# Patient Record
Sex: Female | Born: 1975 | Race: White | Hispanic: No | Smoking: Current every day smoker
Health system: Southern US, Community
[De-identification: ages and names within clinical notes are randomized; demographics above are authoritative.]

## PROBLEM LIST (undated history)

## (undated) ENCOUNTER — Emergency Department (HOSPITAL_COMMUNITY): Discharge status: Absent without leave | Payer: Medicare Other

## (undated) DIAGNOSIS — M1990 Unspecified osteoarthritis, unspecified site: Secondary | ICD-10-CM

## (undated) DIAGNOSIS — R569 Unspecified convulsions: Secondary | ICD-10-CM

## (undated) DIAGNOSIS — Z87898 Personal history of other specified conditions: Secondary | ICD-10-CM

## (undated) DIAGNOSIS — A419 Sepsis, unspecified organism: Secondary | ICD-10-CM

## (undated) DIAGNOSIS — K219 Gastro-esophageal reflux disease without esophagitis: Secondary | ICD-10-CM

## (undated) DIAGNOSIS — F419 Anxiety disorder, unspecified: Secondary | ICD-10-CM

## (undated) DIAGNOSIS — G709 Myoneural disorder, unspecified: Secondary | ICD-10-CM

## (undated) DIAGNOSIS — A4902 Methicillin resistant Staphylococcus aureus infection, unspecified site: Secondary | ICD-10-CM

## (undated) DIAGNOSIS — T7840XA Allergy, unspecified, initial encounter: Secondary | ICD-10-CM

## (undated) DIAGNOSIS — F502 Bulimia nervosa, unspecified: Secondary | ICD-10-CM

## (undated) DIAGNOSIS — F319 Bipolar disorder, unspecified: Secondary | ICD-10-CM

## (undated) DIAGNOSIS — E039 Hypothyroidism, unspecified: Secondary | ICD-10-CM

## (undated) DIAGNOSIS — F329 Major depressive disorder, single episode, unspecified: Secondary | ICD-10-CM

## (undated) DIAGNOSIS — S069XAA Unspecified intracranial injury with loss of consciousness status unknown, initial encounter: Secondary | ICD-10-CM

## (undated) DIAGNOSIS — R519 Headache, unspecified: Secondary | ICD-10-CM

## (undated) DIAGNOSIS — J449 Chronic obstructive pulmonary disease, unspecified: Secondary | ICD-10-CM

## (undated) DIAGNOSIS — T8859XA Other complications of anesthesia, initial encounter: Secondary | ICD-10-CM

## (undated) DIAGNOSIS — F32A Depression, unspecified: Secondary | ICD-10-CM

## (undated) DIAGNOSIS — K449 Diaphragmatic hernia without obstruction or gangrene: Secondary | ICD-10-CM

## (undated) DIAGNOSIS — D649 Anemia, unspecified: Secondary | ICD-10-CM

## (undated) DIAGNOSIS — F1910 Other psychoactive substance abuse, uncomplicated: Secondary | ICD-10-CM

## (undated) DIAGNOSIS — N200 Calculus of kidney: Secondary | ICD-10-CM

## (undated) DIAGNOSIS — R630 Anorexia: Secondary | ICD-10-CM

## (undated) DIAGNOSIS — G8929 Other chronic pain: Secondary | ICD-10-CM

## (undated) DIAGNOSIS — J45909 Unspecified asthma, uncomplicated: Secondary | ICD-10-CM

## (undated) DIAGNOSIS — J189 Pneumonia, unspecified organism: Secondary | ICD-10-CM

## (undated) DIAGNOSIS — N6009 Solitary cyst of unspecified breast: Secondary | ICD-10-CM

## (undated) HISTORY — DX: Sepsis, unspecified organism: A41.9

## (undated) HISTORY — PX: ABDOMINAL HYSTERECTOMY: SHX81

## (undated) HISTORY — DX: Anorexia: R63.0

## (undated) HISTORY — DX: Bulimia nervosa: F50.2

## (undated) HISTORY — DX: Bipolar disorder, unspecified: F31.9

## (undated) HISTORY — PX: COLONOSCOPY: SHX174

## (undated) HISTORY — PX: CARPAL TUNNEL RELEASE: SHX101

## (undated) HISTORY — DX: Unspecified osteoarthritis, unspecified site: M19.90

## (undated) HISTORY — DX: Unspecified asthma, uncomplicated: J45.909

## (undated) HISTORY — DX: Myoneural disorder, unspecified: G70.9

## (undated) HISTORY — DX: Gastro-esophageal reflux disease without esophagitis: K21.9

## (undated) HISTORY — DX: Other chronic pain: G89.29

## (undated) HISTORY — DX: Allergy, unspecified, initial encounter: T78.40XA

## (undated) HISTORY — DX: Unspecified convulsions: R56.9

## (undated) HISTORY — DX: Calculus of kidney: N20.0

## (undated) HISTORY — DX: Bulimia nervosa, unspecified: F50.20

## (undated) HISTORY — DX: Other psychoactive substance abuse, uncomplicated: F19.10

## (undated) HISTORY — PX: TYMPANOSTOMY TUBE PLACEMENT: SHX32

## (undated) HISTORY — DX: Pneumonia, unspecified organism: J18.9

## (undated) HISTORY — DX: Chronic obstructive pulmonary disease, unspecified: J44.9

## (undated) HISTORY — DX: Anemia, unspecified: D64.9

## (undated) HISTORY — DX: Anxiety disorder, unspecified: F41.9

## (undated) HISTORY — DX: Depression, unspecified: F32.A

## (undated) HISTORY — PX: OTHER SURGICAL HISTORY: SHX169

## (undated) HISTORY — DX: Solitary cyst of unspecified breast: N60.09

## (undated) HISTORY — DX: Personal history of other specified conditions: Z87.898

## (undated) HISTORY — DX: Hypothyroidism, unspecified: E03.9

## (undated) HISTORY — DX: Methicillin resistant Staphylococcus aureus infection, unspecified site: A49.02

## (undated) HISTORY — DX: Headache, unspecified: R51.9

## (undated) HISTORY — DX: Unspecified intracranial injury with loss of consciousness status unknown, initial encounter: S06.9XAA

## (undated) HISTORY — PX: UPPER GASTROINTESTINAL ENDOSCOPY: SHX188

## (undated) HISTORY — PX: KIDNEY STONE SURGERY: SHX686

## (undated) HISTORY — DX: Diaphragmatic hernia without obstruction or gangrene: K44.9

---
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
## 2019-07-07 ENCOUNTER — Ambulatory Visit (INDEPENDENT_AMBULATORY_CARE_PROVIDER_SITE_OTHER): Payer: Medicare Other | Admitting: Medical

## 2019-07-07 ENCOUNTER — Other Ambulatory Visit: Payer: Self-pay

## 2019-07-07 ENCOUNTER — Encounter: Payer: Self-pay | Admitting: Medical

## 2019-07-07 ENCOUNTER — Ambulatory Visit (HOSPITAL_BASED_OUTPATIENT_CLINIC_OR_DEPARTMENT_OTHER)
Admission: RE | Admit: 2019-07-07 | Discharge: 2019-07-07 | Disposition: A | Discharge status: Home / Self Care | Payer: Medicare Other | Source: Ambulatory Visit | Attending: Medical | Admitting: Medical

## 2019-07-07 VITALS — BP 100/65 | HR 88 | Temp 97.8°F | Ht 64.0 in | Wt 128.0 lb

## 2019-07-07 DIAGNOSIS — J301 Allergic rhinitis due to pollen: Secondary | ICD-10-CM

## 2019-07-07 DIAGNOSIS — M542 Cervicalgia: Secondary | ICD-10-CM | POA: Diagnosis present

## 2019-07-07 DIAGNOSIS — G40909 Epilepsy, unspecified, not intractable, without status epilepticus: Secondary | ICD-10-CM

## 2019-07-07 DIAGNOSIS — J45909 Unspecified asthma, uncomplicated: Secondary | ICD-10-CM

## 2019-07-07 DIAGNOSIS — K219 Gastro-esophageal reflux disease without esophagitis: Secondary | ICD-10-CM | POA: Diagnosis not present

## 2019-07-07 DIAGNOSIS — F419 Anxiety disorder, unspecified: Secondary | ICD-10-CM

## 2019-07-07 DIAGNOSIS — R300 Dysuria: Secondary | ICD-10-CM

## 2019-07-07 LAB — POCT URINALYSIS DIPSTICK (MANUAL)
Leukocytes, UA: NEGATIVE
Nitrite, UA: NEGATIVE
Poct Bilirubin: NEGATIVE
Poct Blood: NEGATIVE
Poct Glucose: NORMAL mg/dL
Poct Ketones: NEGATIVE
Poct Protein: NEGATIVE mg/dL
Poct Urobilinogen: NORMAL mg/dL
Spec Grav, UA: 1.02 (ref 1.010–1.025)
pH, UA: 6.5 (ref 5.0–8.0)

## 2019-07-07 IMAGING — DX DG CERVICAL SPINE COMPLETE 4+V
5 series · 5 of 5 positions shown · non-contrast
Comparison: None.

CLINICAL DATA: Chronic neck pain, no known injury.

EXAM:
CERVICAL SPINE - COMPLETE 4+ VIEW

[c-spine lat]
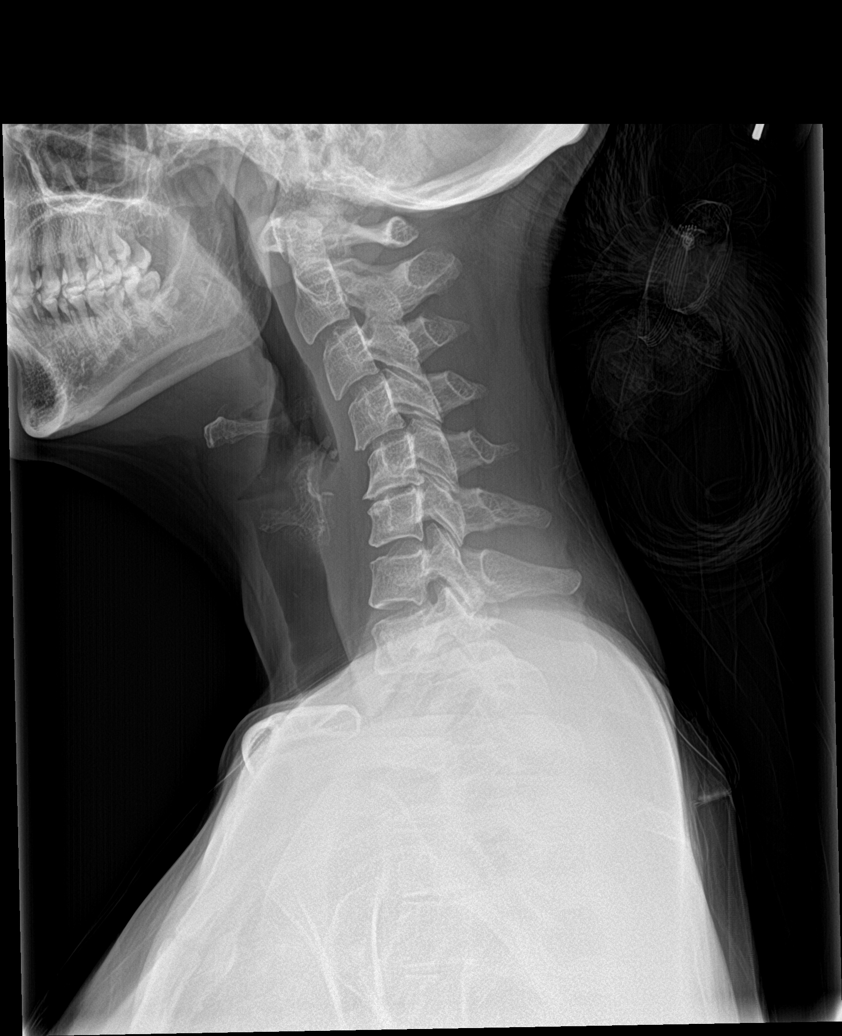

[c-spine obl (1 of 2)]
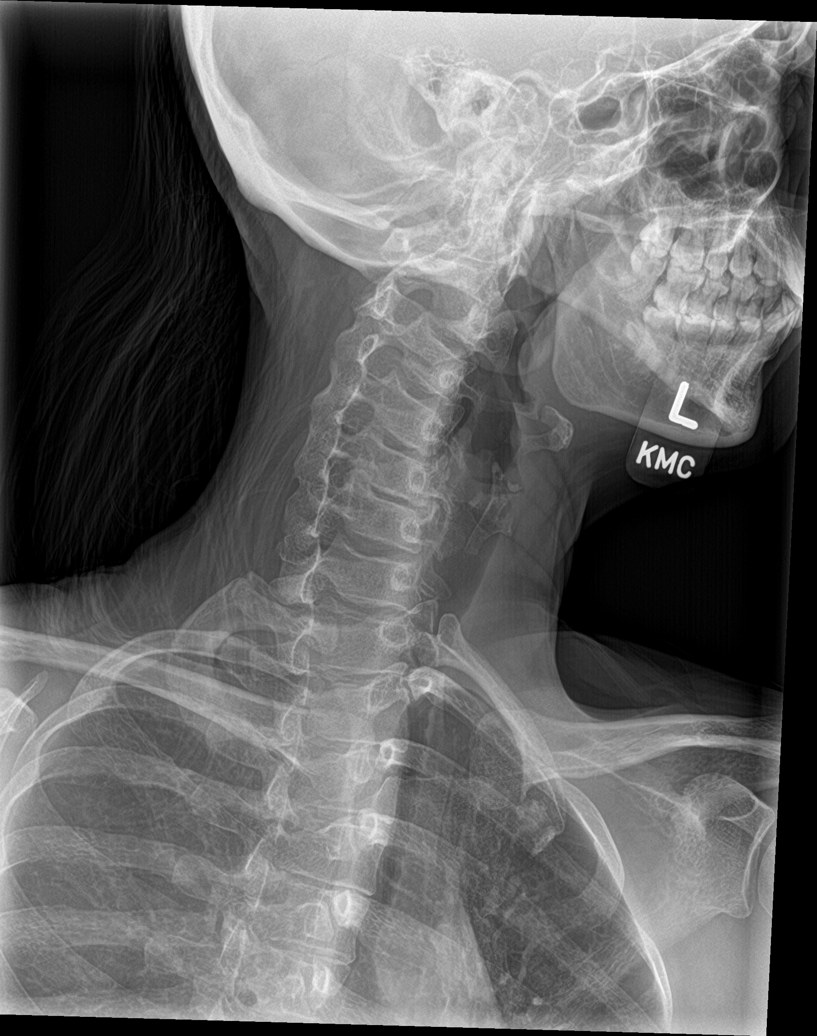

[c-spine obl (2 of 2)]
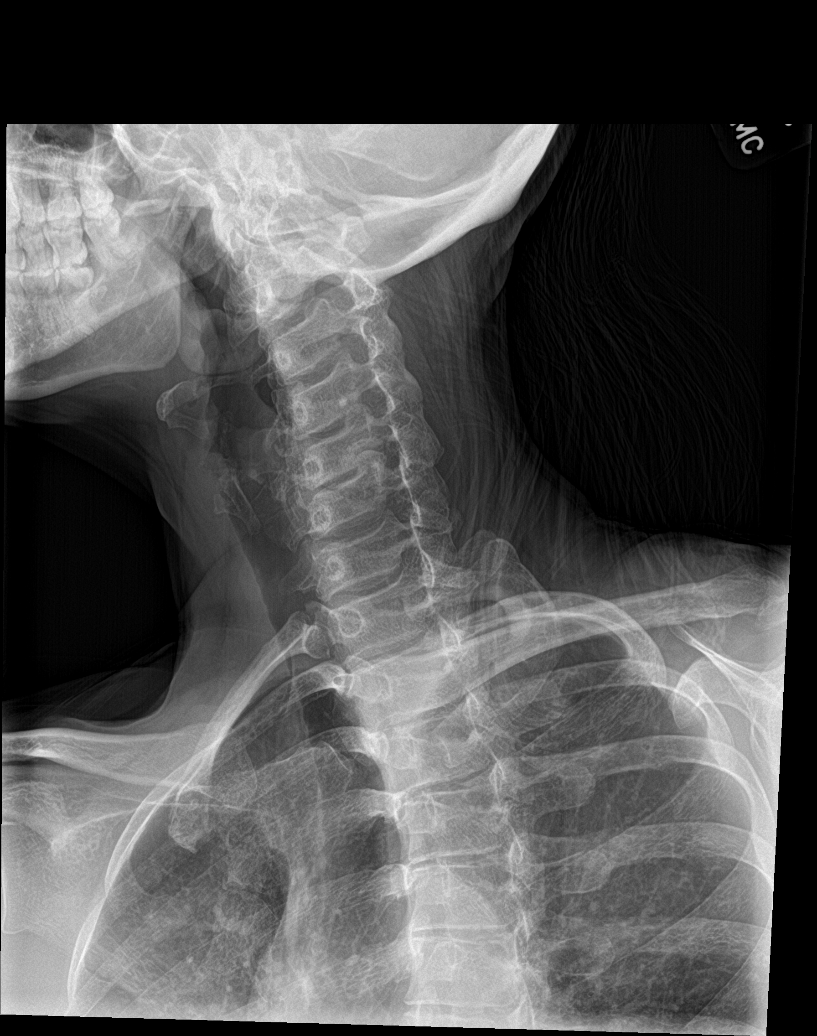

[c-spine ap]
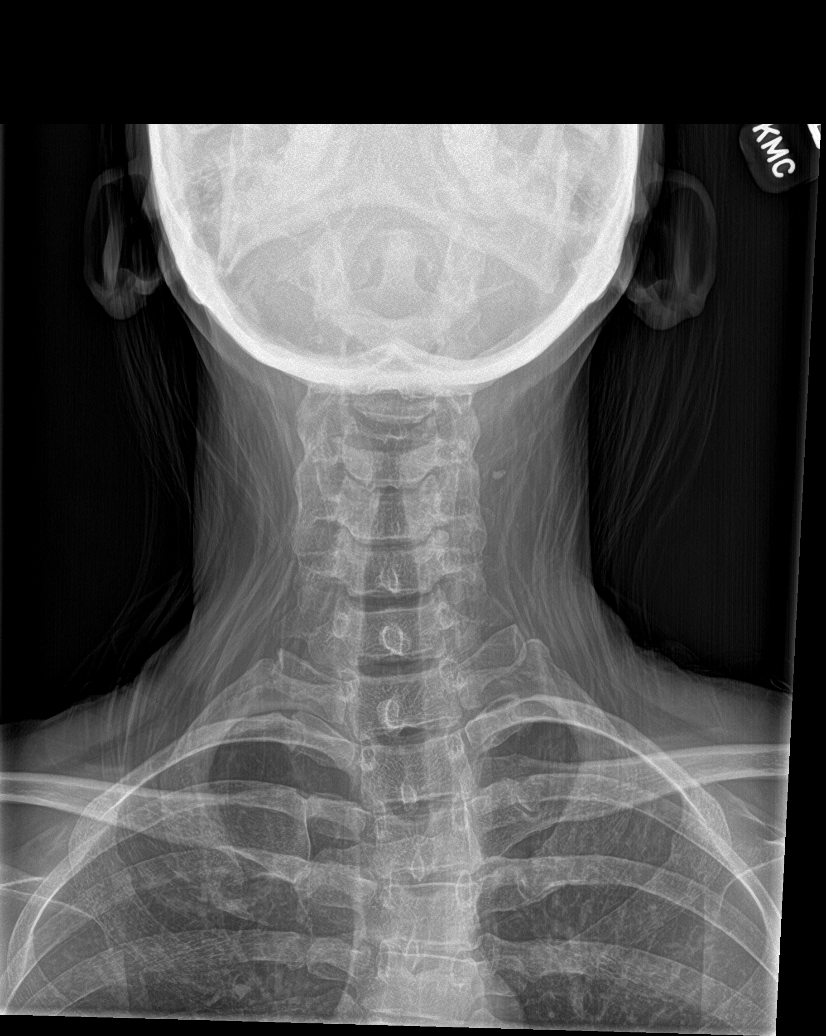

[c-spine open mouth]
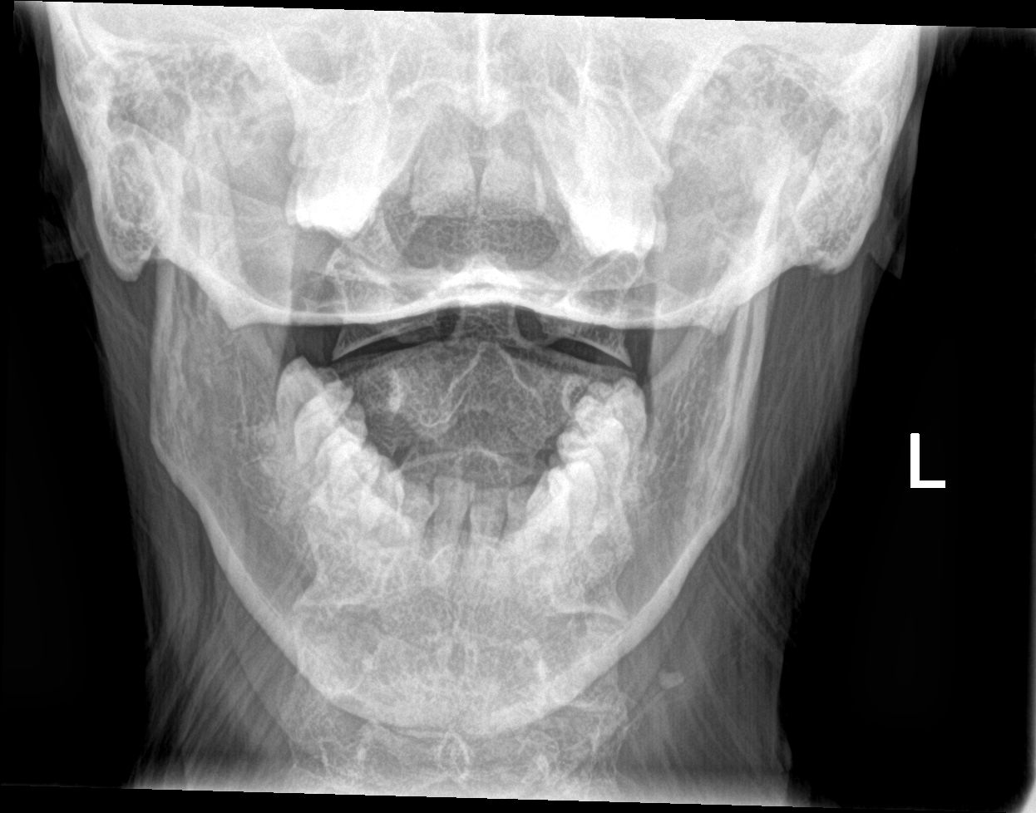

[5 of 5 positions shown; findings below may reference images not displayed]

FINDINGS: Loss of cervical lordosis. Degenerative disc disease at C4-5 and
C5-6 with disc space narrowing and spurring. No neural foraminal
narrowing or significant facet disease. No fracture or subluxation.
The prevertebral soft tissues are normal.
IMPRESSION: Mild degenerative disc and facet disease as above. Loss of cervical
lordosis. No acute bony abnormality.

## 2019-07-07 MED ORDER — LEVOCETIRIZINE DIHYDROCHLORIDE 5 MG PO TABS
5.0000 mg | ORAL_TABLET | Freq: Every evening | ORAL | 3 refills | Status: DC
Start: 2019-07-07 — End: 2019-12-01

## 2019-07-07 MED ORDER — METHOCARBAMOL 500 MG PO TABS: ORAL_TABLET | ORAL | 0 refills | Status: DC

## 2019-07-07 MED ORDER — OMEPRAZOLE 40 MG PO CPDR
40.0000 mg | DELAYED_RELEASE_CAPSULE | Freq: Every day | ORAL | 3 refills | Status: DC
Start: 2019-07-07 — End: 2019-09-04

## 2019-07-07 NOTE — Patient Instructions (Addendum)
For hx of seizures, continue keppra. I did place neurology referral. Please signs release of information form to get old records.  For allergies, will refill your flonase. Add xyzal to help with control.  For asthma well controlled continue albuterol as needed.  For gerd, continue omeprazole.  For anxiety continue with psychiatrist. He has you on high dose benzo. Keep seeing him as I can't write that high level.  For bipolar continue other meds rx'd by psychiatrist.  For dysuria last 2 days we did ua and will send out culture.  For neck pain will get cervical spine xray. Will rx robaxin to use at night.  Follow up in 2 weeks or as needed
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
## 2019-07-08 ENCOUNTER — Encounter: Payer: Self-pay | Admitting: Neurology

## 2019-07-08 LAB — URINE CULTURE
MICRO NUMBER:: 10431406
SPECIMEN QUALITY:: ADEQUATE

## 2019-07-10 ENCOUNTER — Telehealth: Payer: Self-pay

## 2019-07-10 NOTE — Telephone Encounter (Signed)
Labs reviewed.

## 2019-07-10 NOTE — Telephone Encounter (Signed)
Called pt and lvm to return call.  

## 2019-07-10 NOTE — Telephone Encounter (Signed)
Patient called in to see if PA Mackie Pai or the nurse can give her a call to discus her test results. Please call the patient at [phone]

## 2019-07-22 ENCOUNTER — Other Ambulatory Visit (HOSPITAL_COMMUNITY)
Admission: RE | Admit: 2019-07-22 | Discharge: 2019-07-22 | Disposition: A | Discharge status: Home / Self Care | Payer: Medicare Other | Source: Ambulatory Visit | Attending: Medical | Admitting: Medical

## 2019-07-22 ENCOUNTER — Other Ambulatory Visit: Payer: Self-pay

## 2019-07-22 ENCOUNTER — Ambulatory Visit (INDEPENDENT_AMBULATORY_CARE_PROVIDER_SITE_OTHER): Payer: Medicare Other | Admitting: Medical

## 2019-07-22 ENCOUNTER — Encounter: Payer: Self-pay | Admitting: Gastroenterology

## 2019-07-22 VITALS — BP 94/63 | HR 98 | Resp 18 | Ht 64.0 in | Wt 129.6 lb

## 2019-07-22 DIAGNOSIS — F419 Anxiety disorder, unspecified: Secondary | ICD-10-CM | POA: Diagnosis not present

## 2019-07-22 DIAGNOSIS — K219 Gastro-esophageal reflux disease without esophagitis: Secondary | ICD-10-CM

## 2019-07-22 DIAGNOSIS — Z113 Encounter for screening for infections with a predominantly sexual mode of transmission: Secondary | ICD-10-CM

## 2019-07-22 DIAGNOSIS — R300 Dysuria: Secondary | ICD-10-CM | POA: Diagnosis not present

## 2019-07-22 DIAGNOSIS — M542 Cervicalgia: Secondary | ICD-10-CM

## 2019-07-22 MED ORDER — CEPHALEXIN 500 MG PO CAPS
500.0000 mg | ORAL_CAPSULE | Freq: Two times a day (BID) | ORAL | 0 refills | Status: DC
Start: 2019-07-22 — End: 2019-09-03

## 2019-07-22 MED ORDER — FAMOTIDINE 20 MG PO TABS
20.0000 mg | ORAL_TABLET | Freq: Every day | ORAL | 3 refills | Status: DC
Start: 2019-07-22 — End: 2019-10-15

## 2019-07-22 MED ORDER — METHOCARBAMOL 500 MG PO TABS: ORAL_TABLET | ORAL | 0 refills | Status: DC

## 2019-07-22 NOTE — Patient Instructions (Addendum)
For gerd/reflux will continue omeprazole. Will add famotadine. Will go ahead and refer to GI.  Will get std screen. Urine ancillary and labs.  For dysuria and mixed flora culture will rx keflex.  For neck pain with bulging disc hx will refer to sport med. Refilled your robaxin.  Follow up one month or as needed
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
## 2019-07-23 ENCOUNTER — Telehealth: Payer: Self-pay | Admitting: Medical

## 2019-07-23 LAB — URINE CYTOLOGY ANCILLARY ONLY
Chlamydia: NEGATIVE
Comment: NEGATIVE
Comment: NEGATIVE
Comment: NORMAL
Neisseria Gonorrhea: NEGATIVE
Trichomonas: POSITIVE — AB

## 2019-07-23 LAB — HIV ANTIBODY (ROUTINE TESTING W REFLEX): HIV 1&2 Ab, 4th Generation: NONREACTIVE

## 2019-07-23 LAB — RPR: RPR Ser Ql: NONREACTIVE

## 2019-07-23 MED ORDER — METRONIDAZOLE 500 MG PO TABS: 500.0000 mg | ORAL_TABLET | Freq: Three times a day (TID) | ORAL | 0 refills | Status: DC

## 2019-07-23 NOTE — Telephone Encounter (Signed)
Flagyl rx sent to pt pharmacy.

## 2019-07-24 ENCOUNTER — Encounter: Payer: Self-pay | Admitting: Family Medicine

## 2019-07-24 ENCOUNTER — Other Ambulatory Visit: Payer: Self-pay

## 2019-07-24 ENCOUNTER — Ambulatory Visit (INDEPENDENT_AMBULATORY_CARE_PROVIDER_SITE_OTHER): Payer: Medicare Other | Admitting: Family Medicine

## 2019-07-24 VITALS — BP 103/67 | HR 80 | Ht 65.0 in | Wt 125.0 lb

## 2019-07-24 DIAGNOSIS — M7918 Myalgia, other site: Secondary | ICD-10-CM | POA: Diagnosis not present

## 2019-07-24 DIAGNOSIS — M5442 Lumbago with sciatica, left side: Secondary | ICD-10-CM | POA: Insufficient documentation

## 2019-07-24 DIAGNOSIS — M5440 Lumbago with sciatica, unspecified side: Secondary | ICD-10-CM | POA: Insufficient documentation

## 2019-07-24 DIAGNOSIS — M542 Cervicalgia: Secondary | ICD-10-CM | POA: Diagnosis not present

## 2019-07-24 DIAGNOSIS — M899 Disorder of bone, unspecified: Secondary | ICD-10-CM | POA: Insufficient documentation
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
## 2019-07-24 NOTE — Patient Instructions (Signed)
Nice to meet you  Please try heat  Please try a thera cane or lacrosse ball for massage  Please try to the exercises  Please take 2 Rayos as close to 10 pm until you run out.  Physical therapy will give you a call Please send me a message in MyChart with any questions or updates.  Please see me back in 4 weeks.   --Dr. Raeford Razor

## 2019-07-24 NOTE — Progress Notes (Signed)
Medication Samples have been provided to the patient.  Drug name: Rayos      Strength: 5mg         Qty: 2 Boxes  LOT: [id number]  Exp.Date: 01/2020  Dosing instructions: Take 2 tablets by mouth at bedtime.  The patient has been instructed regarding the correct time, dose, and frequency of taking this medication, including desired effects and most common side effects.   Sherrie George, [place] 11:23 AM 07/24/2019

## 2019-07-28 ENCOUNTER — Ambulatory Visit: Payer: Medicare Other | Attending: Family Medicine | Admitting: Physical Therapy

## 2019-07-28 ENCOUNTER — Encounter: Payer: Self-pay | Admitting: Physical Therapy

## 2019-07-28 ENCOUNTER — Other Ambulatory Visit: Payer: Self-pay | Admitting: Medical

## 2019-07-28 ENCOUNTER — Encounter: Payer: Self-pay | Admitting: Medical

## 2019-07-28 ENCOUNTER — Other Ambulatory Visit: Payer: Self-pay

## 2019-07-28 DIAGNOSIS — R29898 Other symptoms and signs involving the musculoskeletal system: Secondary | ICD-10-CM | POA: Insufficient documentation

## 2019-07-28 DIAGNOSIS — R29818 Other symptoms and signs involving the nervous system: Secondary | ICD-10-CM | POA: Diagnosis present

## 2019-07-28 DIAGNOSIS — M5442 Lumbago with sciatica, left side: Secondary | ICD-10-CM | POA: Insufficient documentation

## 2019-07-28 DIAGNOSIS — M6281 Muscle weakness (generalized): Secondary | ICD-10-CM | POA: Diagnosis present

## 2019-07-28 DIAGNOSIS — M5413 Radiculopathy, cervicothoracic region: Secondary | ICD-10-CM | POA: Insufficient documentation

## 2019-07-28 DIAGNOSIS — M5441 Lumbago with sciatica, right side: Secondary | ICD-10-CM | POA: Diagnosis present

## 2019-07-28 DIAGNOSIS — R262 Difficulty in walking, not elsewhere classified: Secondary | ICD-10-CM | POA: Diagnosis present

## 2019-07-28 DIAGNOSIS — G8929 Other chronic pain: Secondary | ICD-10-CM | POA: Insufficient documentation

## 2019-07-28 DIAGNOSIS — M542 Cervicalgia: Secondary | ICD-10-CM | POA: Insufficient documentation
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
## 2019-07-29 ENCOUNTER — Other Ambulatory Visit: Payer: Self-pay | Admitting: Medical

## 2019-07-30 ENCOUNTER — Ambulatory Visit: Payer: Medicare Other

## 2019-07-30 ENCOUNTER — Other Ambulatory Visit: Payer: Self-pay

## 2019-07-30 DIAGNOSIS — M5442 Lumbago with sciatica, left side: Secondary | ICD-10-CM

## 2019-07-30 DIAGNOSIS — G8929 Other chronic pain: Secondary | ICD-10-CM

## 2019-07-30 DIAGNOSIS — M542 Cervicalgia: Secondary | ICD-10-CM | POA: Diagnosis not present

## 2019-07-30 DIAGNOSIS — R29818 Other symptoms and signs involving the nervous system: Secondary | ICD-10-CM

## 2019-07-30 DIAGNOSIS — R262 Difficulty in walking, not elsewhere classified: Secondary | ICD-10-CM

## 2019-07-30 DIAGNOSIS — M6281 Muscle weakness (generalized): Secondary | ICD-10-CM

## 2019-07-30 DIAGNOSIS — R29898 Other symptoms and signs involving the musculoskeletal system: Secondary | ICD-10-CM

## 2019-07-30 DIAGNOSIS — M5413 Radiculopathy, cervicothoracic region: Secondary | ICD-10-CM

## 2019-07-30 NOTE — Patient Instructions (Addendum)
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
## 2019-08-05 ENCOUNTER — Ambulatory Visit: Payer: Medicare Other

## 2019-08-06 ENCOUNTER — Other Ambulatory Visit: Payer: Self-pay

## 2019-08-06 ENCOUNTER — Ambulatory Visit (INDEPENDENT_AMBULATORY_CARE_PROVIDER_SITE_OTHER): Payer: Medicare Other | Admitting: Medical

## 2019-08-06 ENCOUNTER — Ambulatory Visit: Payer: Medicare Other | Attending: Family Medicine

## 2019-08-06 ENCOUNTER — Other Ambulatory Visit (HOSPITAL_COMMUNITY)
Admission: RE | Admit: 2019-08-06 | Discharge: 2019-08-06 | Disposition: A | Discharge status: Home / Self Care | Payer: Medicare Other | Source: Ambulatory Visit | Attending: Medical | Admitting: Medical

## 2019-08-06 VITALS — BP 93/70 | HR 75 | Resp 18 | Ht 65.0 in | Wt 133.0 lb

## 2019-08-06 DIAGNOSIS — M5413 Radiculopathy, cervicothoracic region: Secondary | ICD-10-CM | POA: Insufficient documentation

## 2019-08-06 DIAGNOSIS — M79641 Pain in right hand: Secondary | ICD-10-CM | POA: Diagnosis not present

## 2019-08-06 DIAGNOSIS — Z113 Encounter for screening for infections with a predominantly sexual mode of transmission: Secondary | ICD-10-CM | POA: Diagnosis not present

## 2019-08-06 DIAGNOSIS — M6281 Muscle weakness (generalized): Secondary | ICD-10-CM | POA: Diagnosis present

## 2019-08-06 DIAGNOSIS — M542 Cervicalgia: Secondary | ICD-10-CM

## 2019-08-06 DIAGNOSIS — M5442 Lumbago with sciatica, left side: Secondary | ICD-10-CM | POA: Insufficient documentation

## 2019-08-06 DIAGNOSIS — R29898 Other symptoms and signs involving the musculoskeletal system: Secondary | ICD-10-CM | POA: Diagnosis present

## 2019-08-06 DIAGNOSIS — R262 Difficulty in walking, not elsewhere classified: Secondary | ICD-10-CM | POA: Insufficient documentation

## 2019-08-06 DIAGNOSIS — M5441 Lumbago with sciatica, right side: Secondary | ICD-10-CM | POA: Diagnosis present

## 2019-08-06 DIAGNOSIS — J301 Allergic rhinitis due to pollen: Secondary | ICD-10-CM

## 2019-08-06 DIAGNOSIS — R29818 Other symptoms and signs involving the nervous system: Secondary | ICD-10-CM

## 2019-08-06 DIAGNOSIS — G8929 Other chronic pain: Secondary | ICD-10-CM
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
## 2019-08-06 NOTE — Progress Notes (Signed)
tri

## 2019-08-06 NOTE — Patient Instructions (Signed)
For recent history of trichomonas will get repeat urine ancillary test post treatment at your request.  For rt hand and wrist pain use wrist cock up splint daily. Hx of injury to that ext/desribe nerve injury. Also some possible carpel tunnel syndrome possible. Will see what sport medicine and neurologist.  Allergies controlled with xyzal and flonase.  Neck pain improved with muscle relaxant and night. Seeing PT as well.  Follow up 6 weeks.
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
## 2019-08-07 LAB — URINE CYTOLOGY ANCILLARY ONLY
Comment: NEGATIVE
Trichomonas: POSITIVE — AB

## 2019-08-08 ENCOUNTER — Other Ambulatory Visit: Payer: Self-pay | Admitting: Medical

## 2019-08-09 ENCOUNTER — Telehealth: Payer: Self-pay | Admitting: Medical

## 2019-08-09 MED ORDER — TINIDAZOLE 500 MG PO TABS: ORAL_TABLET | ORAL | 0 refills | Status: DC

## 2019-08-09 NOTE — Telephone Encounter (Signed)
tinazadole rx sent to pt pharmacy.

## 2019-08-11 ENCOUNTER — Telehealth: Payer: Self-pay | Admitting: Medical

## 2019-08-11 ENCOUNTER — Ambulatory Visit: Payer: Medicare Other | Admitting: Physical Therapy

## 2019-08-11 NOTE — Telephone Encounter (Signed)
Caller : Sharon Bolton  Call Back # [phone]   Patient calling in regards to lab results, patient is requesting a call back .

## 2019-08-13 ENCOUNTER — Other Ambulatory Visit: Payer: Self-pay

## 2019-08-13 ENCOUNTER — Encounter: Payer: Self-pay | Admitting: Physical Therapy

## 2019-08-13 ENCOUNTER — Ambulatory Visit: Payer: Medicare Other | Admitting: Physical Therapy

## 2019-08-13 DIAGNOSIS — M6281 Muscle weakness (generalized): Secondary | ICD-10-CM

## 2019-08-13 DIAGNOSIS — G8929 Other chronic pain: Secondary | ICD-10-CM

## 2019-08-13 DIAGNOSIS — R29818 Other symptoms and signs involving the nervous system: Secondary | ICD-10-CM

## 2019-08-13 DIAGNOSIS — R29898 Other symptoms and signs involving the musculoskeletal system: Secondary | ICD-10-CM

## 2019-08-13 DIAGNOSIS — M542 Cervicalgia: Secondary | ICD-10-CM

## 2019-08-13 DIAGNOSIS — R262 Difficulty in walking, not elsewhere classified: Secondary | ICD-10-CM

## 2019-08-13 DIAGNOSIS — M5413 Radiculopathy, cervicothoracic region: Secondary | ICD-10-CM

## 2019-08-13 MED ORDER — FLUCONAZOLE 150 MG PO TABS
150.0000 mg | ORAL_TABLET | Freq: Once | ORAL | 0 refills | Status: AC
Start: 2019-08-13 — End: 2019-08-13
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
## 2019-08-15 ENCOUNTER — Ambulatory Visit: Payer: Medicare Other | Admitting: Nurse Practitioner

## 2019-08-18 ENCOUNTER — Ambulatory Visit (INDEPENDENT_AMBULATORY_CARE_PROVIDER_SITE_OTHER): Payer: Medicare Other | Admitting: Family Medicine

## 2019-08-18 ENCOUNTER — Ambulatory Visit: Payer: Medicare Other

## 2019-08-18 ENCOUNTER — Other Ambulatory Visit: Payer: Self-pay

## 2019-08-18 ENCOUNTER — Ambulatory Visit (HOSPITAL_BASED_OUTPATIENT_CLINIC_OR_DEPARTMENT_OTHER)
Admission: RE | Admit: 2019-08-18 | Discharge: 2019-08-18 | Disposition: A | Discharge status: Home / Self Care | Payer: Medicare Other | Source: Ambulatory Visit | Attending: Family Medicine | Admitting: Family Medicine

## 2019-08-18 ENCOUNTER — Other Ambulatory Visit: Payer: Self-pay | Admitting: Medical

## 2019-08-18 ENCOUNTER — Encounter: Payer: Self-pay | Admitting: Family Medicine

## 2019-08-18 VITALS — BP 104/69 | HR 71 | Ht 64.0 in | Wt 125.0 lb

## 2019-08-18 DIAGNOSIS — M542 Cervicalgia: Secondary | ICD-10-CM | POA: Diagnosis not present

## 2019-08-18 DIAGNOSIS — M79671 Pain in right foot: Secondary | ICD-10-CM | POA: Insufficient documentation

## 2019-08-18 DIAGNOSIS — R519 Headache, unspecified: Secondary | ICD-10-CM

## 2019-08-18 DIAGNOSIS — R29818 Other symptoms and signs involving the nervous system: Secondary | ICD-10-CM

## 2019-08-18 DIAGNOSIS — R296 Repeated falls: Secondary | ICD-10-CM | POA: Insufficient documentation

## 2019-08-18 DIAGNOSIS — M5441 Lumbago with sciatica, right side: Secondary | ICD-10-CM

## 2019-08-18 DIAGNOSIS — G8929 Other chronic pain: Secondary | ICD-10-CM

## 2019-08-18 DIAGNOSIS — M5413 Radiculopathy, cervicothoracic region: Secondary | ICD-10-CM

## 2019-08-18 DIAGNOSIS — R262 Difficulty in walking, not elsewhere classified: Secondary | ICD-10-CM

## 2019-08-18 DIAGNOSIS — M6281 Muscle weakness (generalized): Secondary | ICD-10-CM

## 2019-08-18 DIAGNOSIS — R29898 Other symptoms and signs involving the musculoskeletal system: Secondary | ICD-10-CM

## 2019-08-18 DIAGNOSIS — M7918 Myalgia, other site: Secondary | ICD-10-CM

## 2019-08-18 IMAGING — DX DG ANKLE COMPLETE 3+V*R*
3 series · 3 of 3 positions shown · non-contrast
Comparison: None.

CLINICAL DATA: Chronic right foot and ankle pain

EXAM:
RIGHT ANKLE - COMPLETE 3+ VIEW; RIGHT FOOT COMPLETE - 3+ VIEW

[ankle ap]
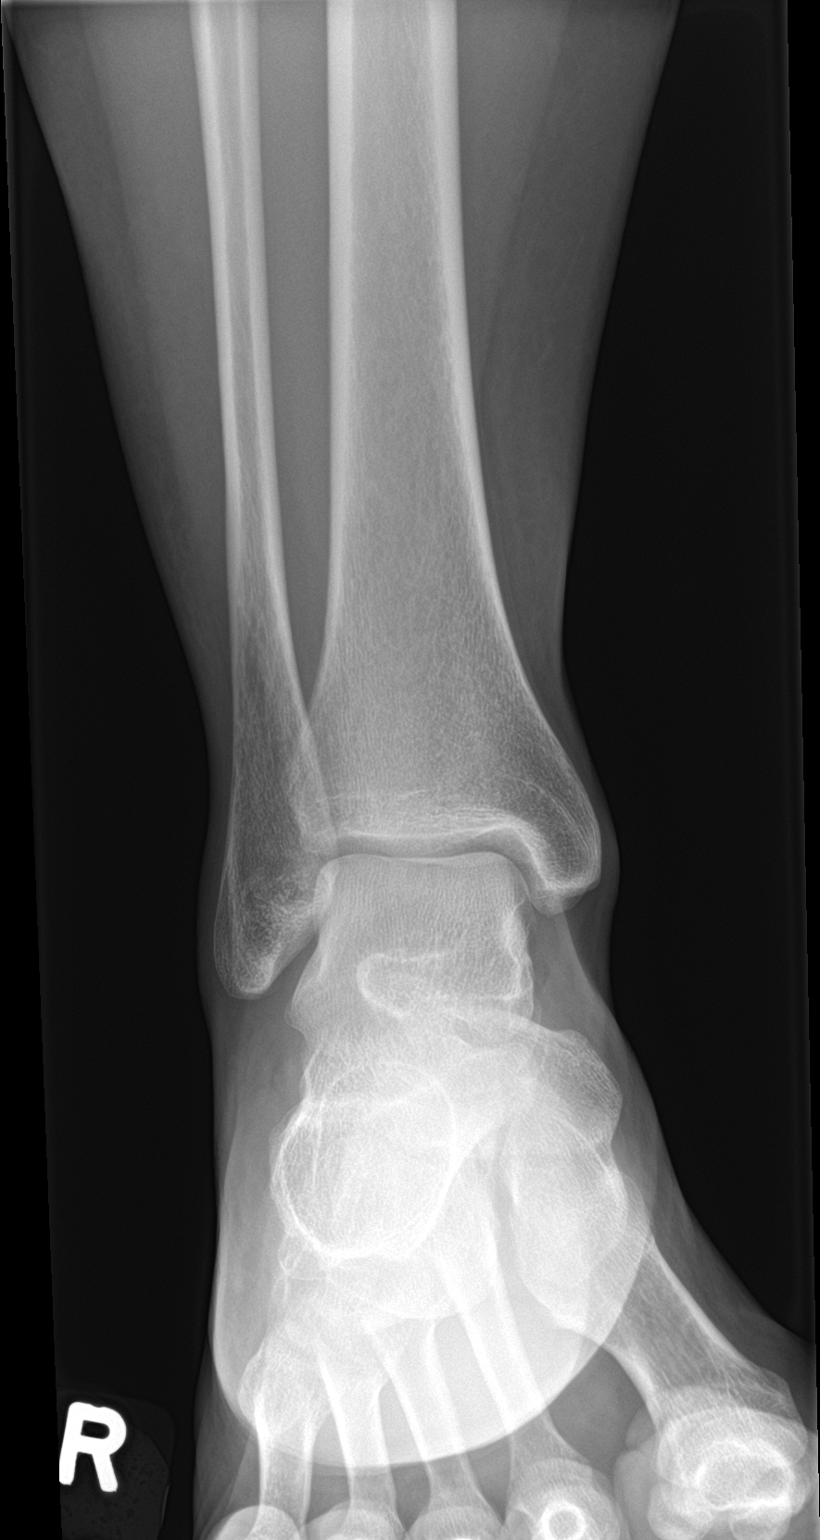

[ankle obl]
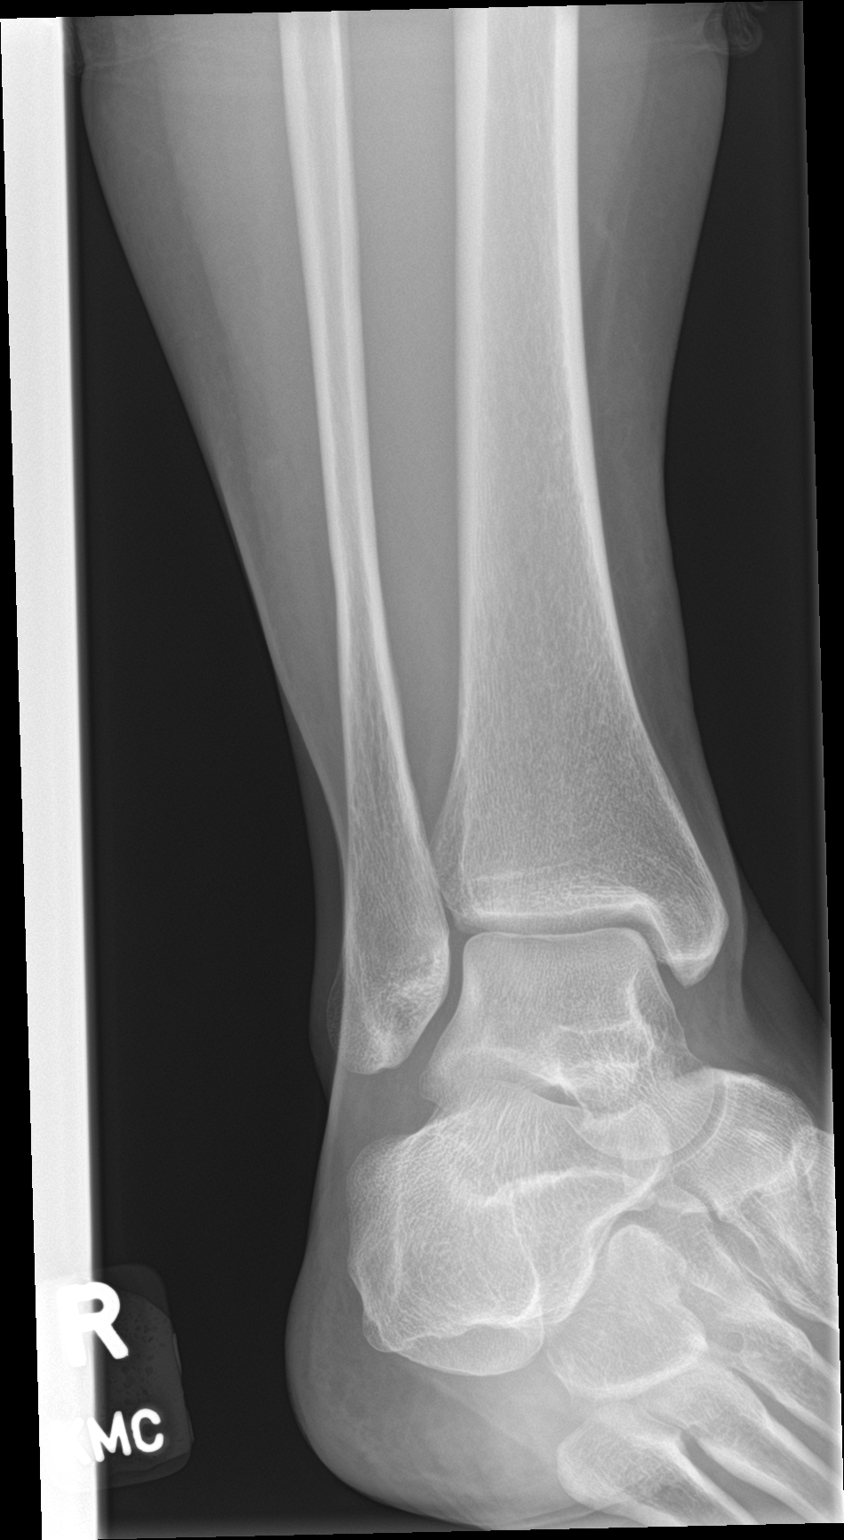

[ankle lat]
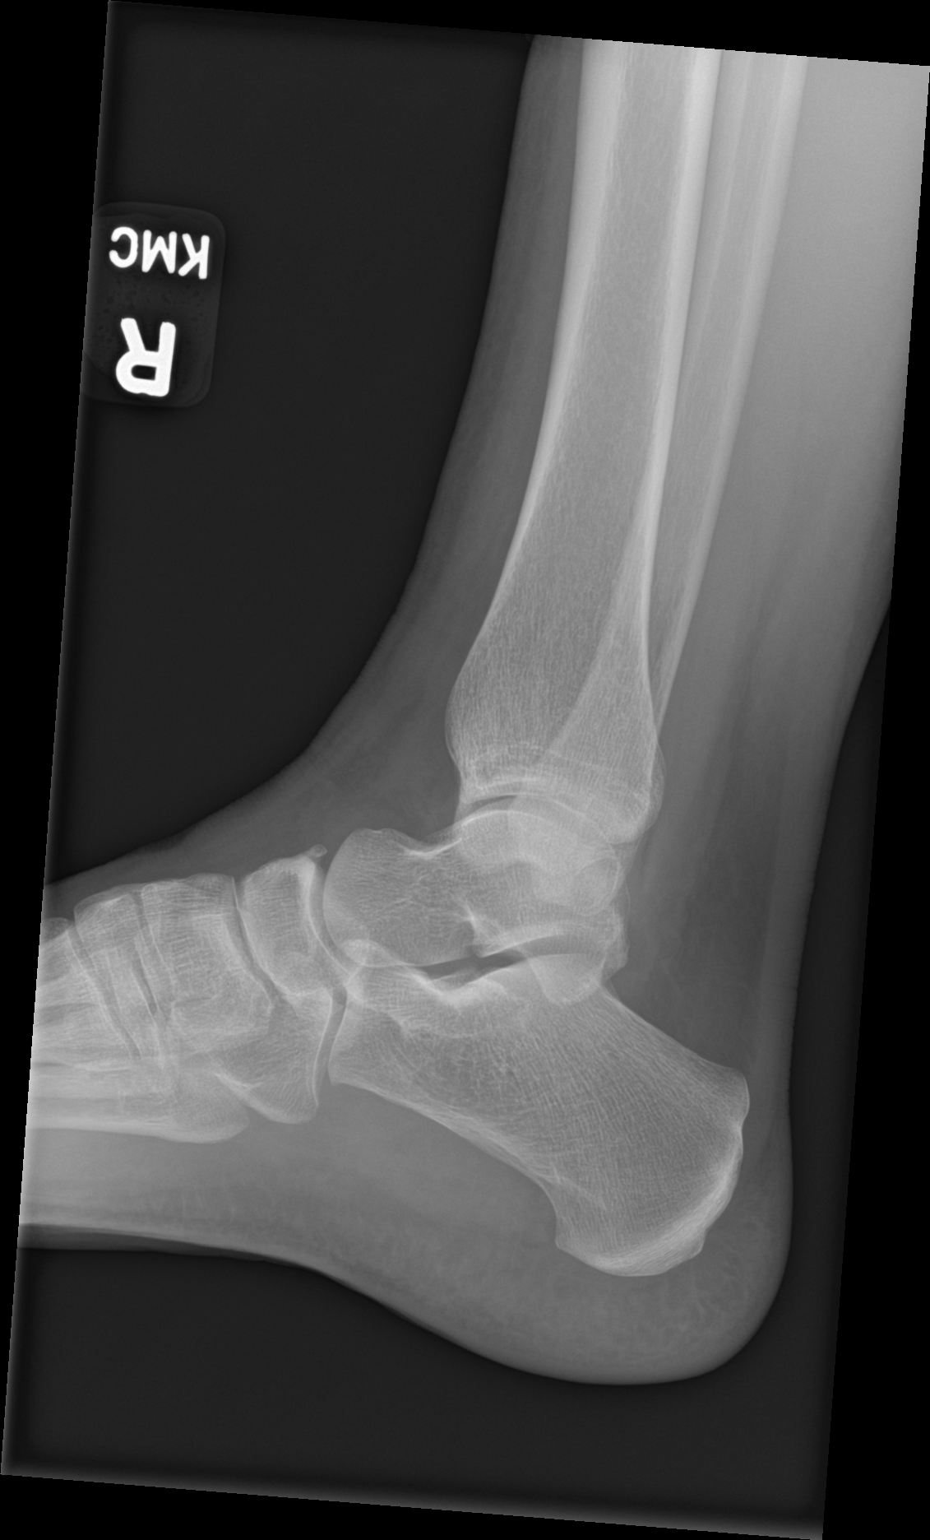

[3 of 3 positions shown; findings below may reference images not displayed]

FINDINGS: There is no evidence of fracture, dislocation, or joint effusion.
Incidental note of a small area of sclerosis within the great toe
distal phalanx, likely a bone island. There is no evidence of
arthropathy or other focal bone abnormality. Soft tissues are
unremarkable.
IMPRESSION: Essentially unremarkable radiographs of the right foot and ankle. No
acute findings or significant arthropathy.

## 2019-08-18 IMAGING — DX DG FOOT COMPLETE 3+V*R*
3 series · 3 of 3 positions shown · non-contrast
Comparison: None.

CLINICAL DATA: Chronic right foot and ankle pain

EXAM:
RIGHT ANKLE - COMPLETE 3+ VIEW; RIGHT FOOT COMPLETE - 3+ VIEW

[foot ap]
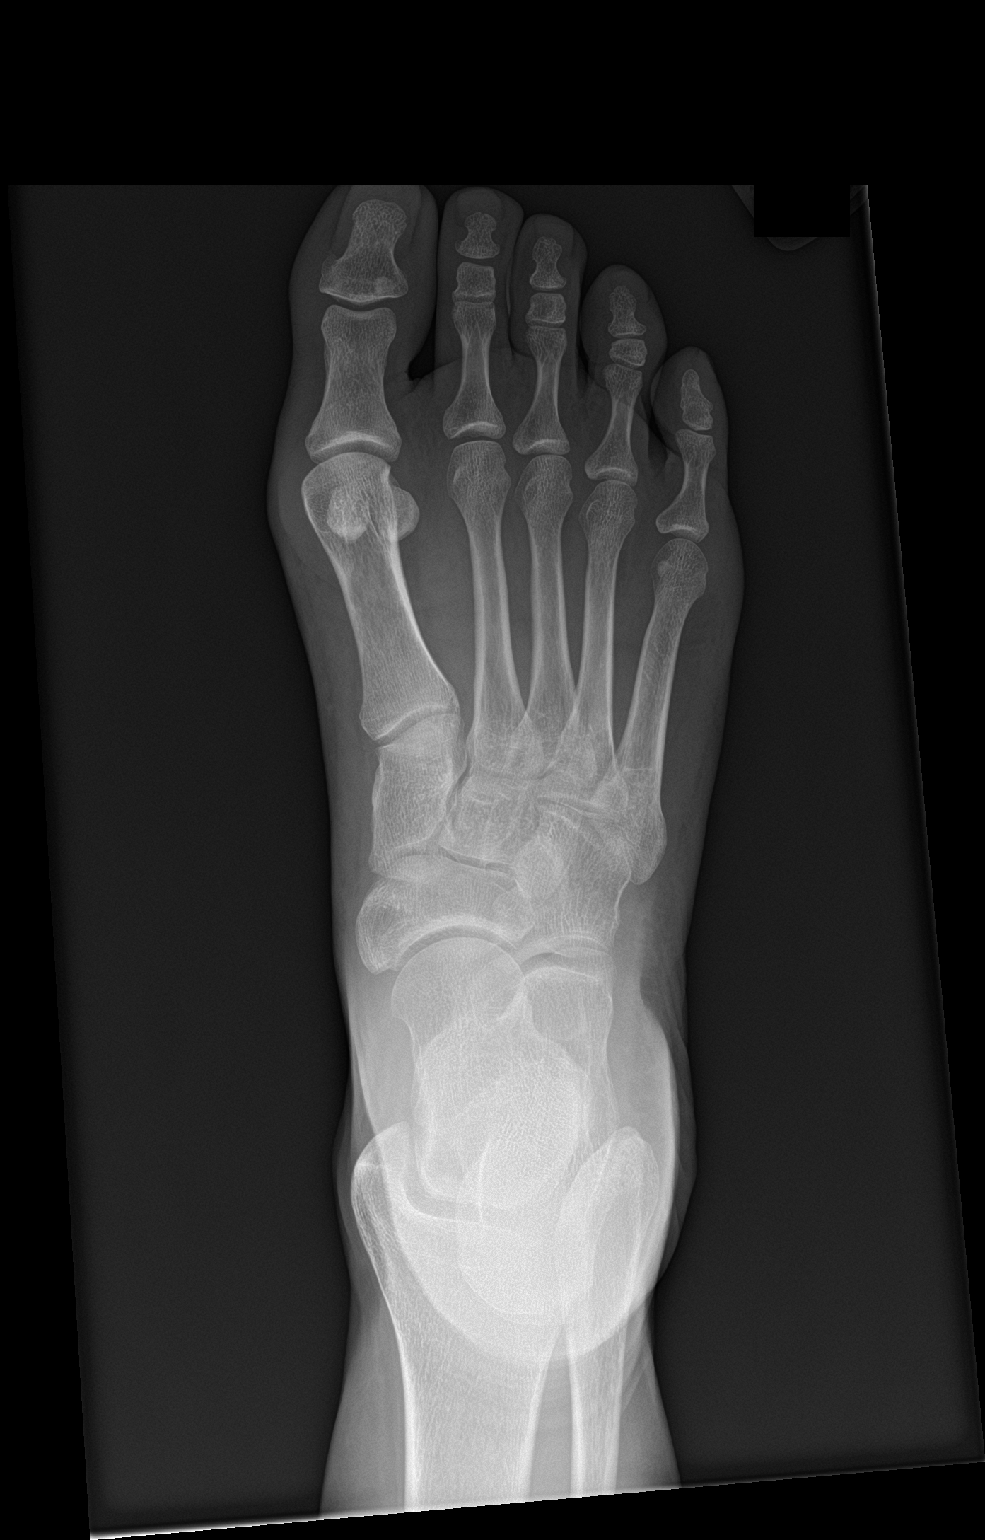

[foot obl]
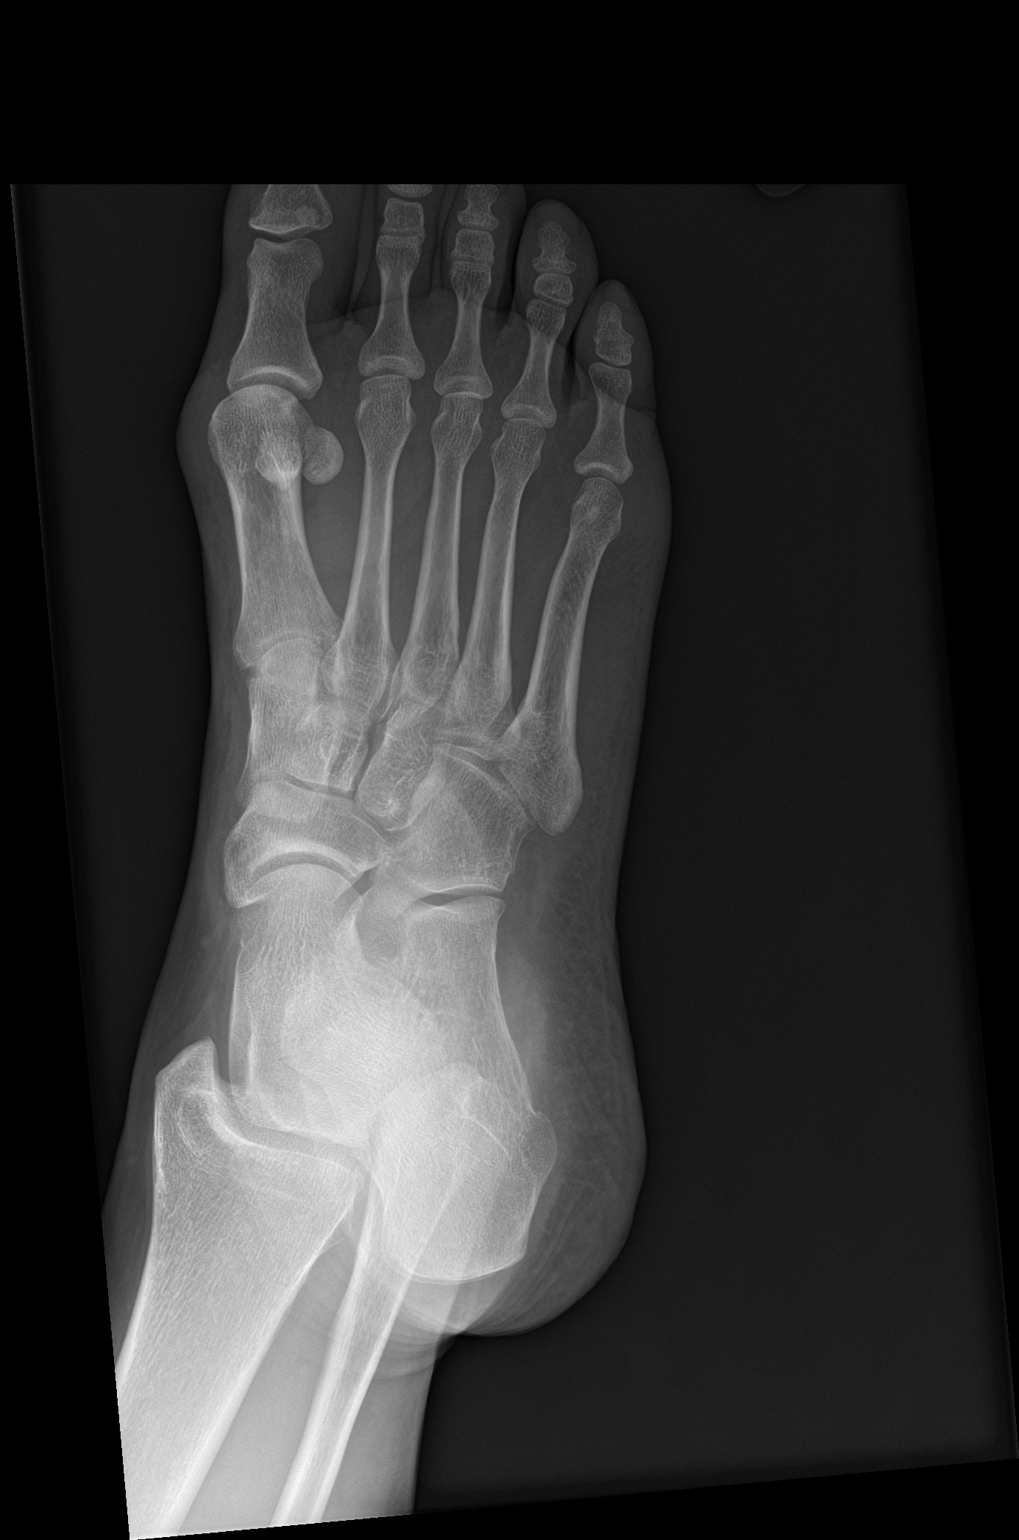

[foot lat]
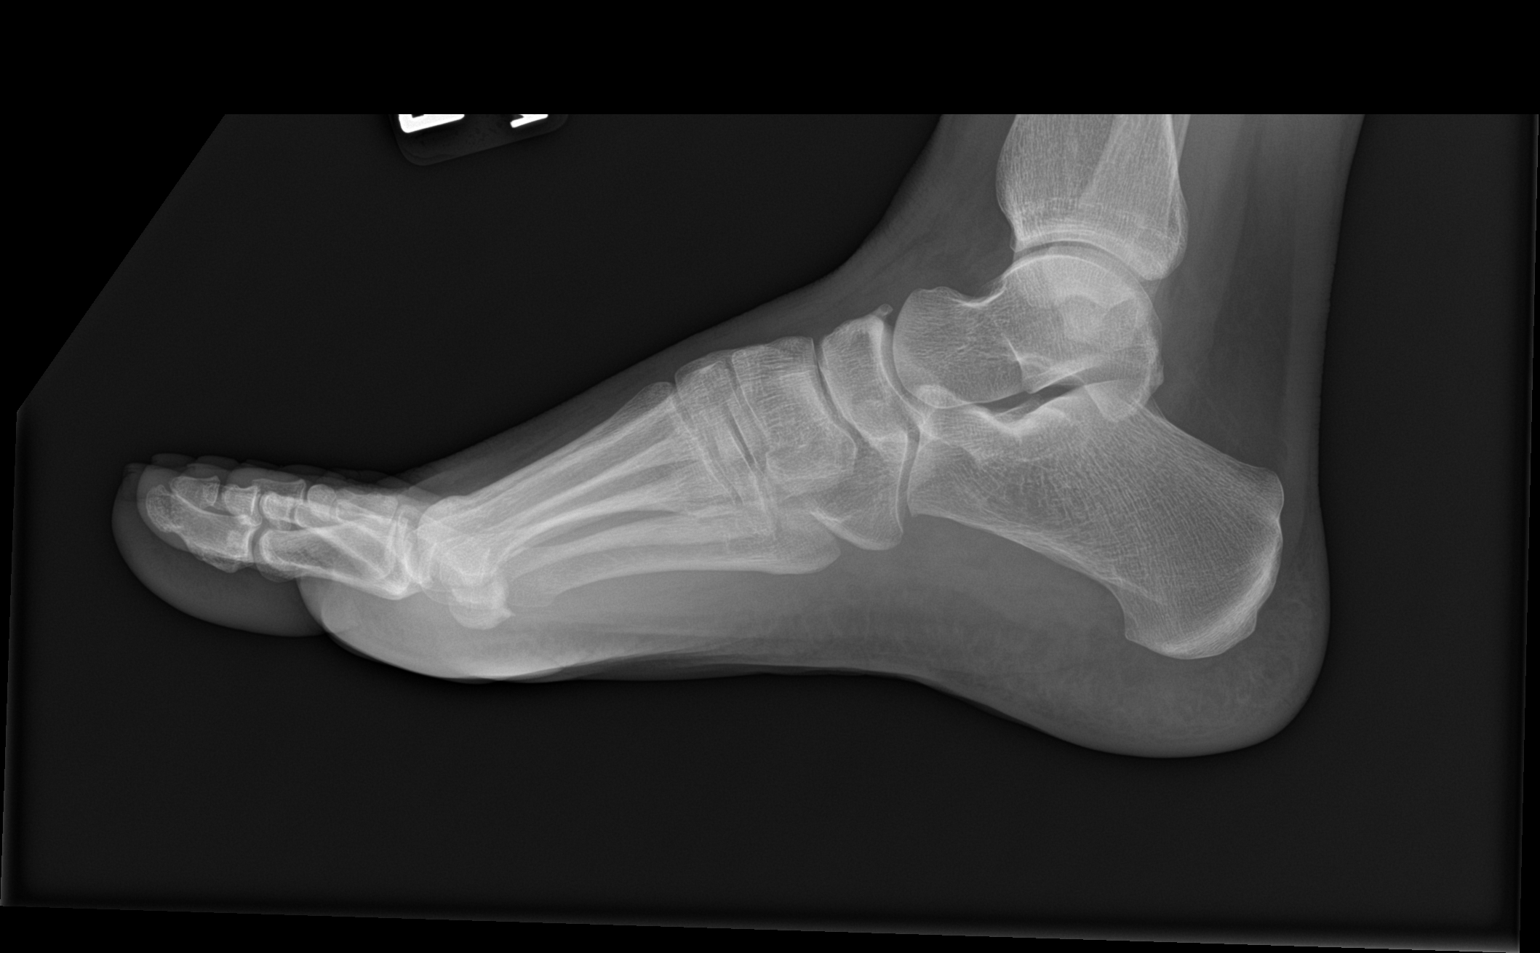

[3 of 3 positions shown; findings below may reference images not displayed]

FINDINGS: There is no evidence of fracture, dislocation, or joint effusion.
Incidental note of a small area of sclerosis within the great toe
distal phalanx, likely a bone island. There is no evidence of
arthropathy or other focal bone abnormality. Soft tissues are
unremarkable.
IMPRESSION: Essentially unremarkable radiographs of the right foot and ankle. No
acute findings or significant arthropathy.
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
## 2019-08-18 NOTE — Assessment & Plan Note (Addendum)
Had a fall recently.  Having pain over the dorsum of the foot.  Nonspecific pain. -Counseled on home exercise therapy and supportive care. -X-ray. -Placed a lateral heel wedge as she feels like she is going to supinate - pennsaid samples

## 2019-08-18 NOTE — Progress Notes (Signed)
Medication Samples have been provided to the patient.  Drug name: Pennsaid       Strength: 2%        Qty: 2 Boxes  LOT: [id number]  Exp.Date: 03/2020  Dosing instructions: Use a pea size amount and rub gently.  The patient has been instructed regarding the correct time, dose, and frequency of taking this medication, including desired effects and most common side effects.   Sherrie George, [place] 11:44 AM 08/18/2019
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
## 2019-08-18 NOTE — Patient Instructions (Signed)
Good to see you Please try the walker  Please try the rub on medicine  I will call with the results from today   Please send me a message in MyChart with any questions or updates.  Please see me back in 6-8 weeks.   --Dr. Raeford Razor

## 2019-08-18 NOTE — Progress Notes (Signed)
Sharon Bolton - 44 y.o. female MRN [id number]  Date of birth: 03-Aug-1975  SUBJECTIVE:  Including CC & ROS.  Chief Complaint  Patient presents with  . Follow-up    neck / back    Sharon Bolton is a 44 y.o. female that is presenting with headaches, multiple areas of pain and altered sensation and weakness as well as right foot and ankle pain.  She has been going to physical therapy.  She reports different sensations that she has been experiencing.  She does like physical therapy.  She seems to be falling more than usual.  She fell and now has right ankle pain over the anterior lateral aspect of the ankle.  Does not always tripped over something but feels weak and like her feet will give way.  She feels like certain body parts are disconnected from herself.   Review of Systems See HPI   HISTORY: Past Medical, Surgical, Social, and Family History Reviewed & Updated per EMR.   Pertinent Historical Findings include:  Past Medical History:  Diagnosis Date  . Allergy   . Anxiety   . Asthma   . Depression   . GERD (gastroesophageal reflux disease)   . Seizures ([place])     Past Surgical History:  Procedure Laterality Date  . ABDOMINAL HYSTERECTOMY      No family history on file.  Social History   Socioeconomic History  . Marital status: Single    Spouse name: Not on file  . Number of children: Not on file  . Years of education: Not on file  . Highest education level: Not on file  Occupational History  . Not on file  Tobacco Use  . Smoking status: Current Every Day Smoker    Packs/day: 1.00    Years: 25.00    Pack years: 25.00    Types: Cigarettes  . Smokeless tobacco: Never Used  Substance and Sexual Activity  . Alcohol use: Not Currently  . Drug use: Not Currently  . Sexual activity: Not Currently  Other Topics Concern  . Not on file  Social History Narrative  . Not on file   Social Determinants of Health   Financial Resource Strain:   . Difficulty of Paying  Living Expenses:   Food Insecurity:   . Worried About Charity fundraiser in the Last Year:   . Arboriculturist in the Last Year:   Transportation Needs:   . Film/video editor (Medical):   Marland Kitchen Lack of Transportation (Non-Medical):   Physical Activity:   . Days of Exercise per Week:   . Minutes of Exercise per Session:   Stress:   . Feeling of Stress :   Social Connections:   . Frequency of Communication with Friends and Family:   . Frequency of Social Gatherings with Friends and Family:   . Attends Religious Services:   . Active Member of Clubs or Organizations:   . Attends Archivist Meetings:   Marland Kitchen Marital Status:   Intimate Partner Violence:   . Fear of Current or Ex-Partner:   . Emotionally Abused:   Marland Kitchen Physically Abused:   . Sexually Abused:      PHYSICAL EXAM:  VS: BP 104/69   Pulse 71   Ht 5\' 4"  (1.626 m)   Wt 125 lb (56.7 kg)   BMI 21.46 kg/m  Physical Exam Gen: NAD, alert, cooperative with exam, well-appearing MSK:  Deep tendon reflexes at the patella are symmetric. +2 deep tendon  reflexes at the Achilles. No signs of atrophy. Right ankle: Abrasion over the anterior lateral ankle joint. Negative anterior drawer. Normal strength resistance. No ecchymosis or swelling. Neurovascularly intact     ASSESSMENT & PLAN:   Nonintractable headache Has had multiple traumas.  Possible to be postconcussive versus polypharmacy versus tension. -Counseled on supportive care. -Could consider adding nortriptyline. -Has physical therapy but addressing other issues currently. -Could get updated imaging of brain -Could consider EMDR with psychology as she may have some post traumatic issues.  Right foot pain Had a fall recently.  Having pain over the dorsum of the foot.  Nonspecific pain. -Counseled on home exercise therapy and supportive care. -X-ray. -Placed a lateral heel wedge as she feels like she is going to supinate - pennsaid samples    Falls  frequently Reports frequent falls with no specific tripping.  Has dissociation with her extremities.  Possible for multiple injuries that she has sustained as a source.  Seems less likely for neuropathy.  Has seen neurologist in [place] prior brain placed here.  HIV and RPR were negative. -Counseled on supportive care. -Rolling walker. -CMP. -Could consider CBC, B12 and folate.  Myofascial pain Symptoms seem symptomatic but has had significant injuries in the past. -Could consider the addition of Cymbalta or nortriptyline. -Counseled on home exercise therapy and supportive care. -Could place a hold on physical therapy

## 2019-08-19 ENCOUNTER — Telehealth: Payer: Self-pay | Admitting: Family Medicine

## 2019-08-19 LAB — COMPREHENSIVE METABOLIC PANEL
ALT: 14 IU/L (ref 0–32)
AST: 16 IU/L (ref 0–40)
Albumin/Globulin Ratio: 1.9 (ref 1.2–2.2)
Albumin: 4.4 g/dL (ref 3.8–4.8)
Alkaline Phosphatase: 55 IU/L (ref 48–121)
BUN/Creatinine Ratio: 14 (ref 9–23)
BUN: 10 mg/dL (ref 6–24)
Bilirubin Total: 0.2 mg/dL (ref 0.0–1.2)
CO2: 25 mmol/L (ref 20–29)
Calcium: 9.4 mg/dL (ref 8.7–10.2)
Chloride: 99 mmol/L (ref 96–106)
Creatinine, Ser: 0.72 mg/dL (ref 0.57–1.00)
GFR calc Af Amer: 118 mL/min/{1.73_m2} (ref >59)
GFR calc non Af Amer: 102 mL/min/{1.73_m2} (ref >59)
Globulin, Total: 2.3 g/dL (ref 1.5–4.5)
Glucose: 80 mg/dL (ref 65–99)
Potassium: 4.6 mmol/L (ref 3.5–5.2)
Sodium: 138 mmol/L (ref 134–144)
Total Protein: 6.7 g/dL (ref 6.0–8.5)

## 2019-08-19 NOTE — Telephone Encounter (Signed)
Informed of results.   Rosemarie Ax, MD [facility] 08/19/2019, 8:29 AM

## 2019-08-20 ENCOUNTER — Other Ambulatory Visit: Payer: Self-pay

## 2019-08-20 ENCOUNTER — Other Ambulatory Visit (HOSPITAL_COMMUNITY)
Admission: RE | Admit: 2019-08-20 | Discharge: 2019-08-20 | Disposition: A | Discharge status: Home / Self Care | Payer: Medicare Other | Source: Ambulatory Visit | Attending: Medical | Admitting: Medical

## 2019-08-20 ENCOUNTER — Encounter: Payer: Medicare Other | Admitting: Physical Therapy

## 2019-08-20 ENCOUNTER — Ambulatory Visit (INDEPENDENT_AMBULATORY_CARE_PROVIDER_SITE_OTHER): Payer: Medicare Other | Admitting: Medical

## 2019-08-20 ENCOUNTER — Ambulatory Visit: Payer: Medicare Other

## 2019-08-20 VITALS — BP 121/84 | HR 96 | Temp 97.0°F | Resp 18 | Ht 64.0 in | Wt 130.8 lb

## 2019-08-20 DIAGNOSIS — A599 Trichomoniasis, unspecified: Secondary | ICD-10-CM

## 2019-08-20 DIAGNOSIS — R2689 Other abnormalities of gait and mobility: Secondary | ICD-10-CM | POA: Diagnosis not present

## 2019-08-20 DIAGNOSIS — M5410 Radiculopathy, site unspecified: Secondary | ICD-10-CM

## 2019-08-20 DIAGNOSIS — M542 Cervicalgia: Secondary | ICD-10-CM

## 2019-08-20 MED ORDER — DICLOFENAC SODIUM 75 MG PO TBEC
75.0000 mg | DELAYED_RELEASE_TABLET | Freq: Two times a day (BID) | ORAL | 0 refills | Status: DC
Start: 2019-08-20 — End: 2019-08-26

## 2019-08-20 NOTE — Patient Instructions (Signed)
   For your history of neck pain with new radicular pain over the last month, I prescribed diclofenac and refilled Robaxin muscle relaxant.  The neck pain is leading to tension type headache.  Did go ahead and place order for MRI C-spine hopefully that will be authorized.  For history of seizures, continue Keppra.  Appointment with neurologist is pending.  If he had any seizures and intermittently no and I would try to get your appointment moved up.  For recent poor balance episodes, I did go ahead and prescribe you a walker.  If you have any low back pain or radicular pain will get x-ray of lumbar spine.  If any significant worsening balance issues let me know and in that event might try to move up your neurologist appointment as well.  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  Follow-up as scheduled for your complete physical/wellness exam.

## 2019-08-20 NOTE — Progress Notes (Signed)
Subjective:    Patient ID: Sharon Bolton, female    DOB: 1976/02/04, 44 y.o.   MRN: [id number]  HPI  Pt in for follow up.  Pt has recent HA. She has been going to PT for her neck pain. Pt has neck muscles that feel tight. Pt explains PT thinks she has tight neck muscles.   Pt states recently last few days have pain from neck toward her bicep/elbow areas over past week. But pain radiating from neck to both shoulders since around 07-07-2019 per pt report today.   Pt headaches are usually left side occipital area. Pain all the time but pain is more at end of the day.   Pt states lower legs feels uncoordinated at times. No radicular lower ext pain. No low back pain.    Pt has hx of seizures. Pt last saw neurologist 2 years ago. Last seizure 9 month ago approximate. None since. She is on keppra.    Pt had c spine xray done and impression was below. IMPRESSION: Mild degenerative disc and facet disease as above. Loss of cervical lordosis. No acute bony abnormality.  I had just refilled robaxin for pt. It did help. And she also used lidocaine cream.    Pt also had resistant infection of trich. I had to rx additional medication. Will repeat testing.   Review of Systems  Constitutional: Negative for chills, fatigue and fever.  Respiratory: Negative for chest tightness, shortness of breath and wheezing.   Cardiovascular: Negative for chest pain and palpitations.  Gastrointestinal: Negative for abdominal pain.  Genitourinary: Negative for dysuria.  Musculoskeletal: Positive for neck pain. Negative for back pain.  Skin: Negative for rash.  Neurological: Negative for dizziness and headaches.  Hematological: Negative for adenopathy. Does not bruise/bleed easily.  Psychiatric/Behavioral: Negative for behavioral problems.    Past Medical History:  Diagnosis Date  . Allergy   . Anxiety   . Asthma   . Depression   . GERD (gastroesophageal reflux disease)   . Seizures ([place])        Social History   Socioeconomic History  . Marital status: Single    Spouse name: Not on file  . Number of children: Not on file  . Years of education: Not on file  . Highest education level: Not on file  Occupational History  . Not on file  Tobacco Use  . Smoking status: Current Every Day Smoker    Packs/day: 1.00    Years: 25.00    Pack years: 25.00    Types: Cigarettes  . Smokeless tobacco: Never Used  Substance and Sexual Activity  . Alcohol use: Not Currently  . Drug use: Not Currently  . Sexual activity: Not Currently  Other Topics Concern  . Not on file  Social History Narrative  . Not on file   Social Determinants of Health   Financial Resource Strain:   . Difficulty of Paying Living Expenses:   Food Insecurity:   . Worried About Charity fundraiser in the Last Year:   . Arboriculturist in the Last Year:   Transportation Needs:   . Film/video editor (Medical):   Marland Kitchen Lack of Transportation (Non-Medical):   Physical Activity:   . Days of Exercise per Week:   . Minutes of Exercise per Session:   Stress:   . Feeling of Stress :   Social Connections:   . Frequency of Communication with Friends and Family:   . Frequency of Social Gatherings  with Friends and Family:   . Attends Religious Services:   . Active Member of Clubs or Organizations:   . Attends Archivist Meetings:   Marland Kitchen Marital Status:   Intimate Partner Violence:   . Fear of Current or Ex-Partner:   . Emotionally Abused:   Marland Kitchen Physically Abused:   . Sexually Abused:     Past Surgical History:  Procedure Laterality Date  . ABDOMINAL HYSTERECTOMY      No family history on file.  Allergies  Allergen Reactions  . Sulfa Antibiotics     Current Outpatient Medications on File Prior to Visit  Medication Sig Dispense Refill  . albuterol (VENTOLIN HFA) 108 (90 Base) MCG/ACT inhaler Inhale 2 puffs into the lungs every 6 (six) hours as needed for wheezing or shortness of breath.     Marland Kitchen  atomoxetine (STRATTERA) 80 MG capsule Take 80 mg by mouth daily.    . clonazePAM (KLONOPIN) 1 MG tablet Take 1 mg by mouth in the morning, at noon, and at bedtime.    . famotidine (PEPCID) 20 MG tablet Take 1 tablet (20 mg total) by mouth daily. 30 tablet 3  . FLUoxetine (PROZAC) 20 MG tablet Take 20 mg by mouth daily.    . fluticasone (FLONASE) 50 MCG/ACT nasal spray Place 2 sprays into both nostrils daily.    Marland Kitchen gabapentin (NEURONTIN) 400 MG capsule Take 400 mg by mouth 2 (two) times daily.    Marland Kitchen levETIRAcetam (KEPPRA) 500 MG tablet Take 500 mg by mouth 2 (two) times daily.    Marland Kitchen levocetirizine (XYZAL) 5 MG tablet Take 1 tablet (5 mg total) by mouth every evening. 30 tablet 3  . methocarbamol (ROBAXIN) 500 MG tablet TAKE 1 TABLET BY MOUTH AT BEDTIME AS NEEDED FOR  NECK  PAIN 10 tablet 0  . metroNIDAZOLE (FLAGYL) 500 MG tablet Take 1 tablet (500 mg total) by mouth 3 (three) times daily. 30 tablet 0  . omeprazole (PRILOSEC) 40 MG capsule Take 1 capsule (40 mg total) by mouth daily. 30 capsule 3  . predniSONE (RAYOS) 5 MG TBEC Take 2 tablets by mouth at bedtime.    Marland Kitchen PRESCRIPTION MEDICATION Apply 1 application topically as needed. lidocane 1% cream    . tinidazole (TINDAMAX) 500 MG tablet 4 tab po x 1 dose 4 tablet 0  . cephALEXin (KEFLEX) 500 MG capsule Take 1 capsule (500 mg total) by mouth 2 (two) times daily. (Patient not taking: Reported on 08/20/2019) 20 capsule 0   No current facility-administered medications on file prior to visit.    BP 121/84 (BP Location: Left Arm, Patient Position: Sitting, Cuff Size: Normal)   Pulse 96   Temp (!) 97 F (36.1 C) (Temporal)   Resp 18   Ht 5\' 4"  (1.626 m)   Wt 130 lb 12.8 oz (59.3 kg)   SpO2 100%   BMI 22.45 kg/m       Objective:   Physical Exam  General Mental Status- Alert. General Appearance- Not in acute distress.   Skin General: Color- Normal Color. Moisture- Normal Moisture.  Neck Carotid Arteries- Normal color. Moisture- Normal  Moisture. No carotid bruits. No JVD.  Chest and Lung Exam Auscultation: Breath Sounds:-Normal.  Cardiovascular Auscultation:Rythm- Regular. Murmurs & Other Heart Sounds:Auscultation of the heart reveals- No Murmurs.  Abdomen Inspection:-Inspeection Normal. Palpation/Percussion:Note:No mass. Palpation and Percussion of the abdomen reveal- Non Tender, Non Distended + BS, no rebound or guarding.    Neurologic Cranial Nerve exam:- CN III-XII intact(No nystagmus), symmetric  smile. Strength:- 5/5 equal and symmetric strength both upper and lower extremities.      Assessment & Plan:  For your history of neck pain with new radicular pain over the last month, I prescribed diclofenac and refilled Robaxin muscle relaxant.  The neck pain is leading to tension type headache.  Did go ahead and place order for MRI C-spine hopefully that will be authorized.  For history of seizures, continue Keppra.  Appointment with neurologist is pending.  If he had any seizures and intermittently no and I would try to get your appointment moved up.  For recent poor balance episodes, I did go ahead and prescribe you a walker.  If you have any low back pain or radicular pain will get x-ray of lumbar spine.  If any significant worsening balance issues let me know and in that event might try to move up your neurologist appointment as well.  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  Follow-up as scheduled for your complete physical/wellness exam.  Time spent with patient today was  40  minutes which consisted of chart review, discussing diagnosis, discussing mri, work up,treatment and documentation.

## 2019-08-21 ENCOUNTER — Ambulatory Visit: Payer: Medicare Other | Admitting: Family Medicine

## 2019-08-21 LAB — URINE CYTOLOGY ANCILLARY ONLY
Comment: NEGATIVE
Trichomonas: NEGATIVE

## 2019-08-23 ENCOUNTER — Other Ambulatory Visit: Payer: Self-pay

## 2019-08-23 ENCOUNTER — Telehealth: Payer: Self-pay | Admitting: Medical

## 2019-08-23 ENCOUNTER — Emergency Department (HOSPITAL_BASED_OUTPATIENT_CLINIC_OR_DEPARTMENT_OTHER): Payer: Medicare Other

## 2019-08-23 ENCOUNTER — Emergency Department (HOSPITAL_BASED_OUTPATIENT_CLINIC_OR_DEPARTMENT_OTHER)
Admission: EM | Admit: 2019-08-23 | Discharge: 2019-08-23 | Disposition: A | Discharge status: Home / Self Care | Payer: Medicare Other | Attending: Emergency Medicine | Admitting: Emergency Medicine

## 2019-08-23 ENCOUNTER — Encounter (HOSPITAL_BASED_OUTPATIENT_CLINIC_OR_DEPARTMENT_OTHER): Payer: Self-pay | Admitting: Emergency Medicine

## 2019-08-23 ENCOUNTER — Ambulatory Visit (HOSPITAL_BASED_OUTPATIENT_CLINIC_OR_DEPARTMENT_OTHER)
Admission: RE | Admit: 2019-08-23 | Discharge: 2019-08-23 | Disposition: A | Discharge status: Home / Self Care | Payer: Medicare Other | Source: Ambulatory Visit | Attending: Medical | Admitting: Medical

## 2019-08-23 DIAGNOSIS — M5410 Radiculopathy, site unspecified: Secondary | ICD-10-CM

## 2019-08-23 DIAGNOSIS — Y9389 Activity, other specified: Secondary | ICD-10-CM | POA: Diagnosis not present

## 2019-08-23 DIAGNOSIS — S92332A Displaced fracture of third metatarsal bone, left foot, initial encounter for closed fracture: Secondary | ICD-10-CM | POA: Insufficient documentation

## 2019-08-23 DIAGNOSIS — S92342A Displaced fracture of fourth metatarsal bone, left foot, initial encounter for closed fracture: Secondary | ICD-10-CM | POA: Diagnosis not present

## 2019-08-23 DIAGNOSIS — J45909 Unspecified asthma, uncomplicated: Secondary | ICD-10-CM | POA: Diagnosis not present

## 2019-08-23 DIAGNOSIS — W19XXXA Unspecified fall, initial encounter: Secondary | ICD-10-CM | POA: Insufficient documentation

## 2019-08-23 DIAGNOSIS — Y998 Other external cause status: Secondary | ICD-10-CM | POA: Insufficient documentation

## 2019-08-23 DIAGNOSIS — S92322A Displaced fracture of second metatarsal bone, left foot, initial encounter for closed fracture: Secondary | ICD-10-CM | POA: Diagnosis present

## 2019-08-23 DIAGNOSIS — M542 Cervicalgia: Secondary | ICD-10-CM

## 2019-08-23 DIAGNOSIS — Y9289 Other specified places as the place of occurrence of the external cause: Secondary | ICD-10-CM | POA: Diagnosis not present

## 2019-08-23 IMAGING — DX DG FOOT COMPLETE 3+V*L*
3 series · 3 of 3 positions shown · non-contrast
Comparison: None.

CLINICAL DATA: Pain after fall.

EXAM:
LEFT FOOT - COMPLETE 3+ VIEW

[foot ap]
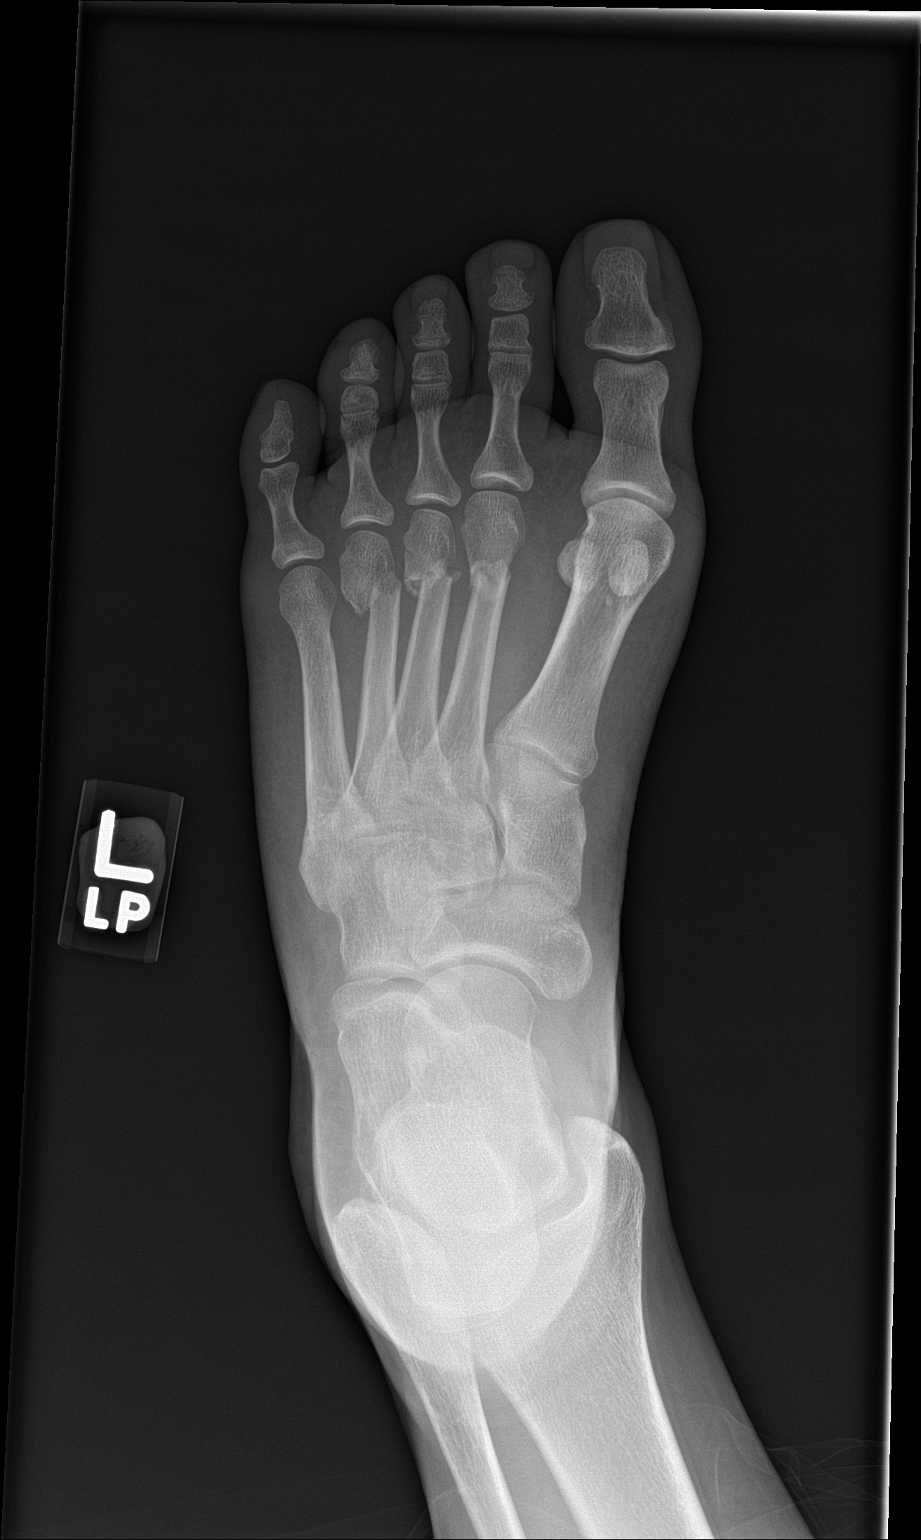

[foot obl]
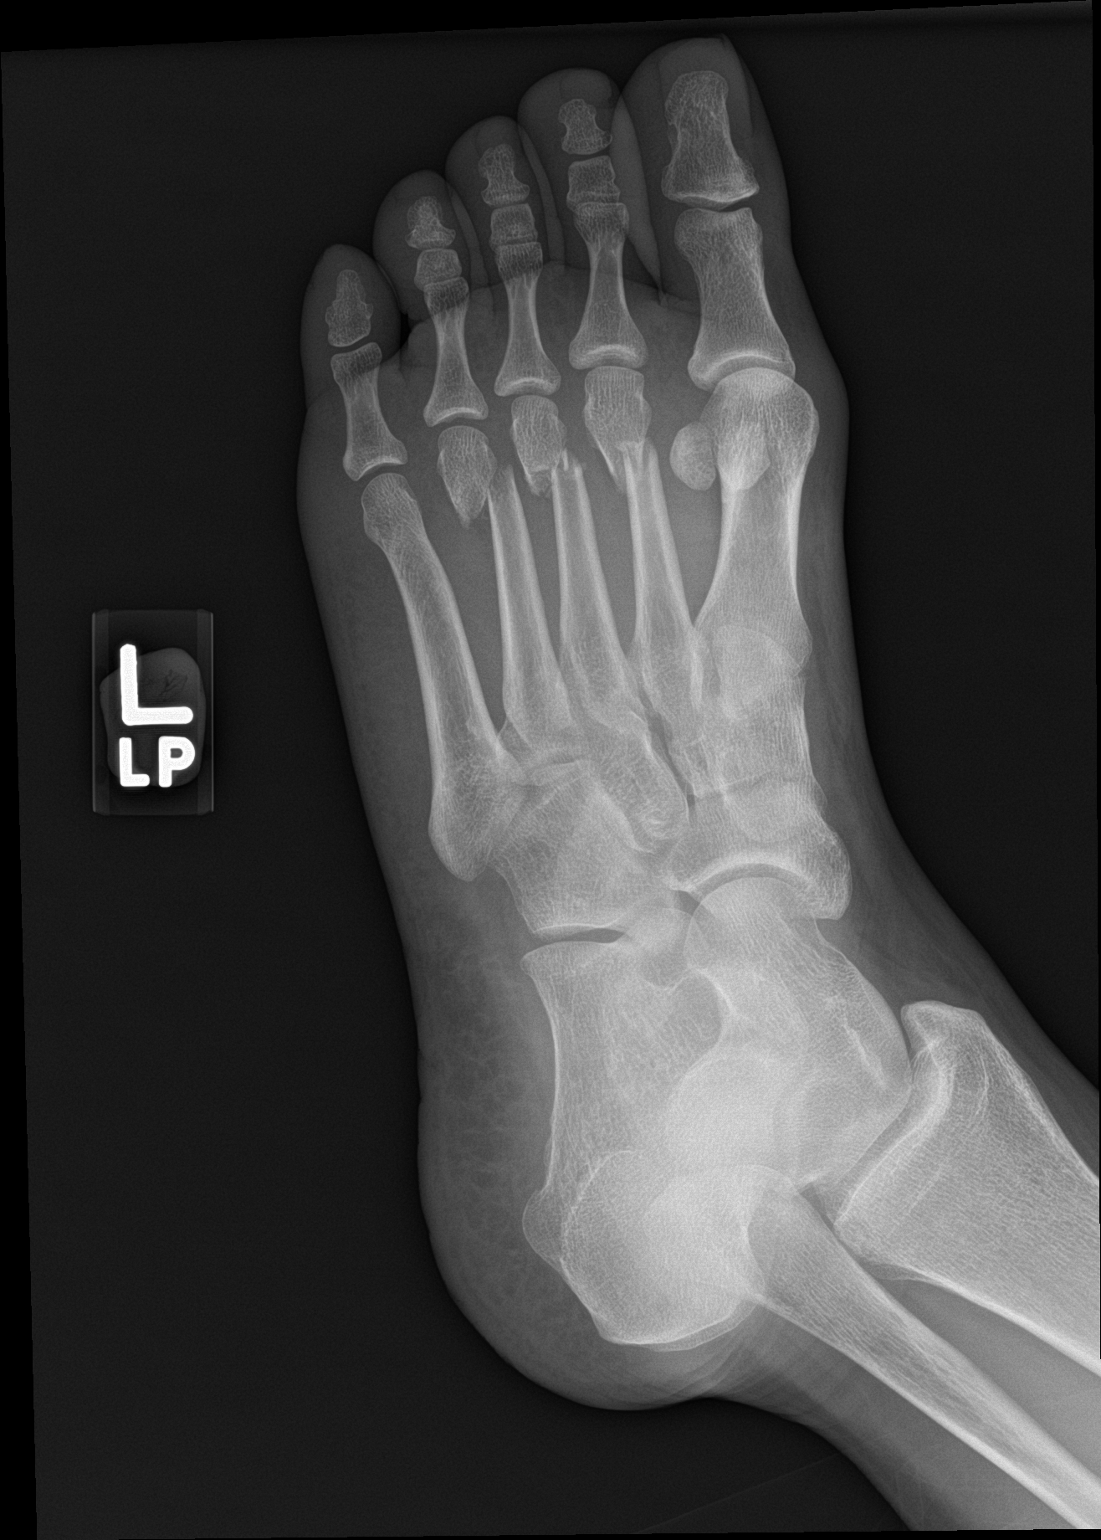

[foot lat]
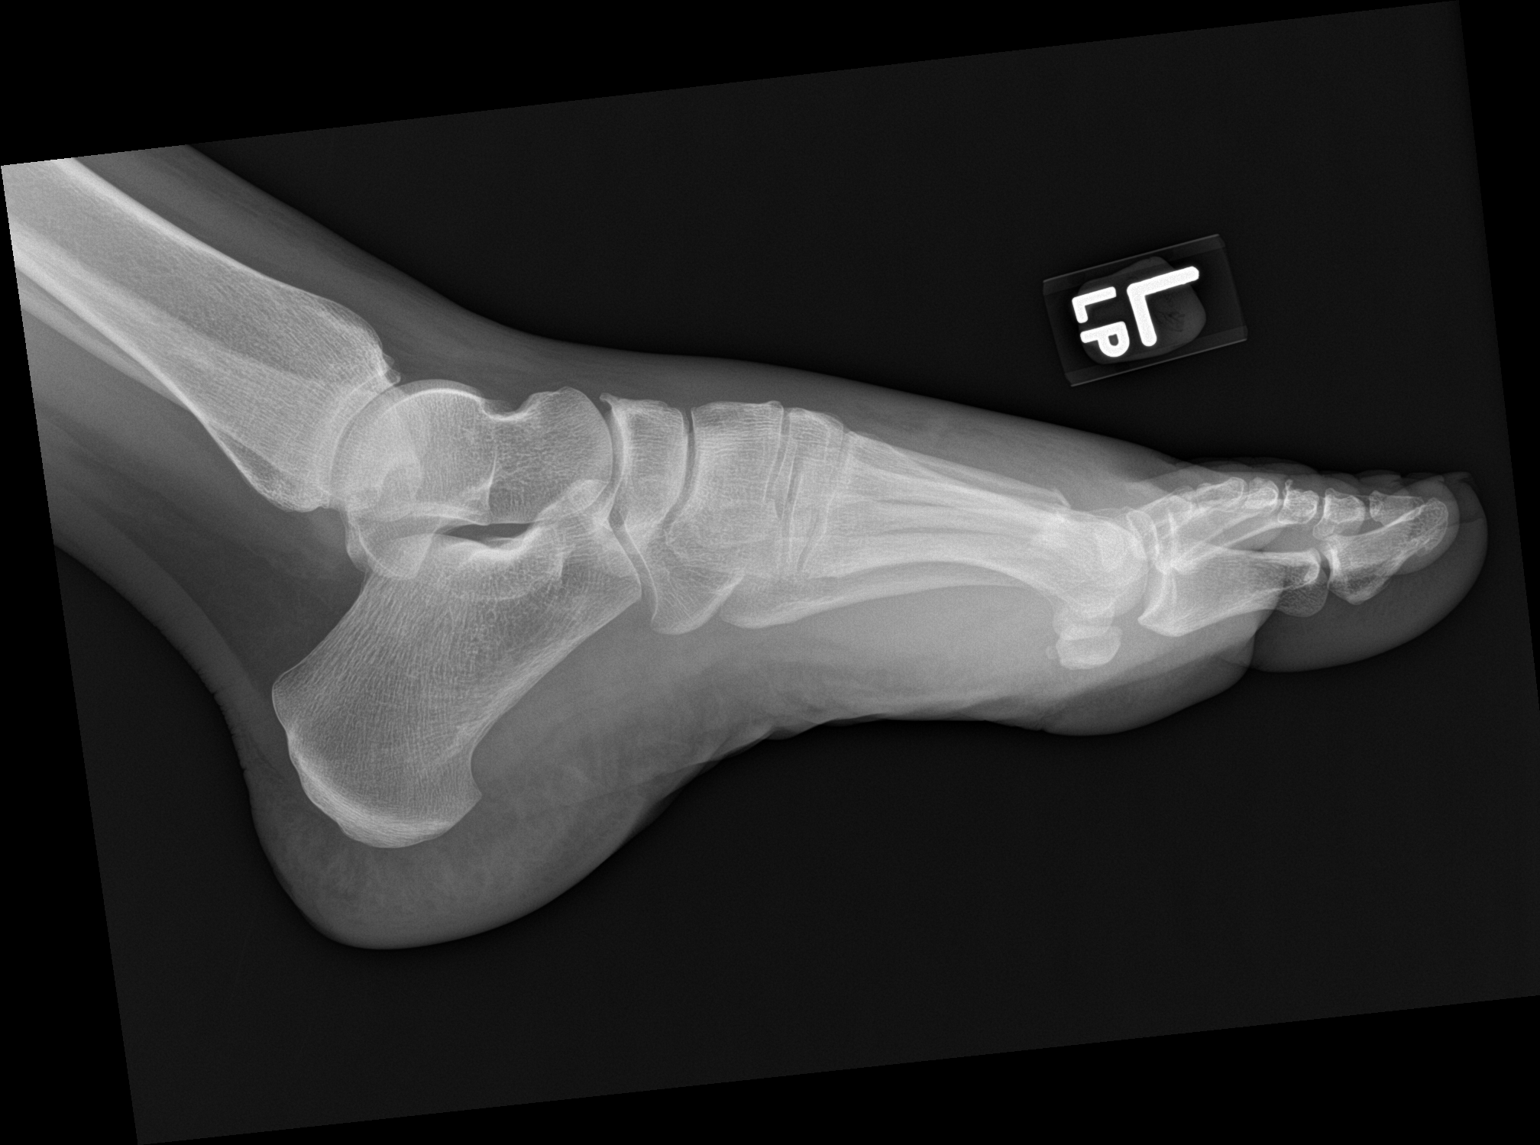

[3 of 3 positions shown; findings below may reference images not displayed]

FINDINGS: Mildly displaced fractures of the distal second, third, and fourth
metatarsals are identified. The Lisfranc joints appear to align
normally. No other fractures or abnormalities are identified.
IMPRESSION: Mildly displaced fractures of the distal second, third, and fourth
metatarsals are identified.

## 2019-08-23 IMAGING — MR MR CERVICAL SPINE W/O CM
5 of 7 series · 33 of 48 positions shown · non-contrast
Comparison: Cervical spine radiographs [DATE]

CLINICAL DATA: Neck pain for greater than 4 weeks. Abnormal
radiographs.

EXAM:
MRI CERVICAL SPINE WITHOUT CONTRAST
TECHNIQUE: Multiplanar, multisequence MR imaging of the cervical spine was
performed. No intravenous contrast was administered.

[Series 2: (id) tse sag · sagittal · 3.0mm · 0.41mm/px · 1 of 14 slices shown]
[im 1/14]
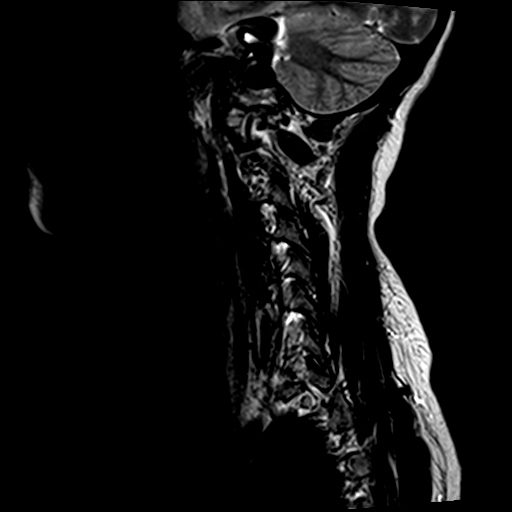

[Series 4: STIR · sagittal · 3.0mm · 0.82mm/px · 5 of 14 slices shown (1 of 2)]
[im 1/14]
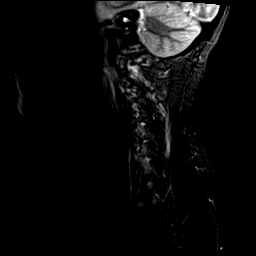
[im 4/14]
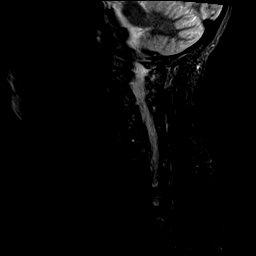
[im 7/14]
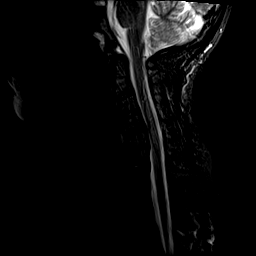
[im 10/14]
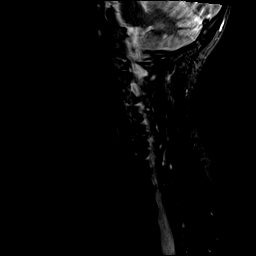
[im 14/14]
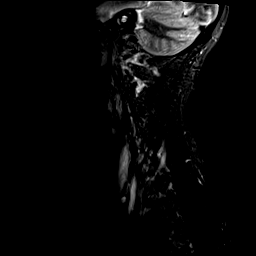

[Series 5: T2 · axial · 3.0mm · 0.39mm/px · z∈[-73,+26]mm · 11 of 30 slices shown (1 of 2)]
[im 1/30]
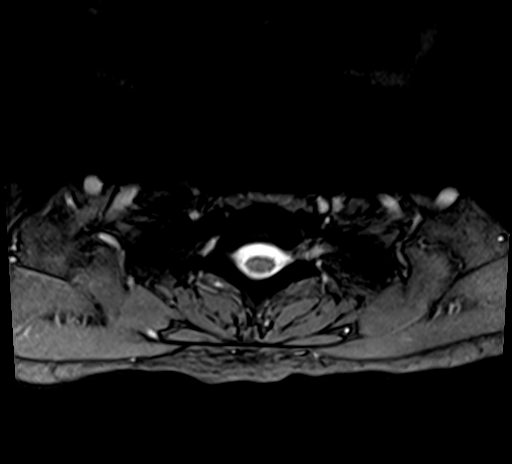
[im 3/30]
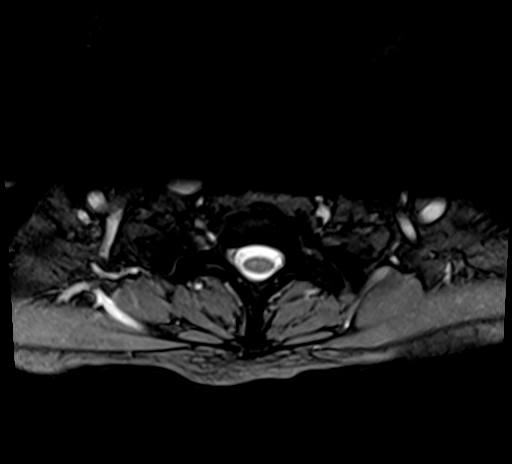
[im 6/30]
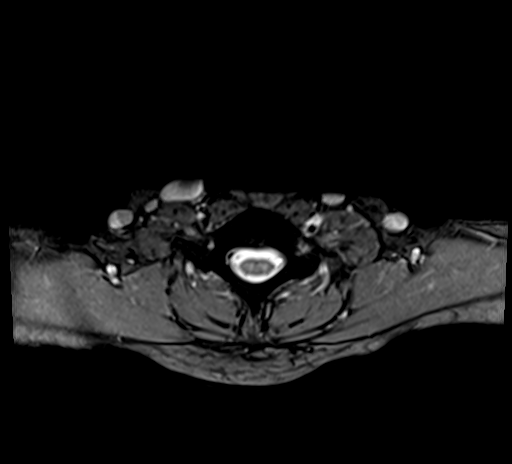
[im 9/30]
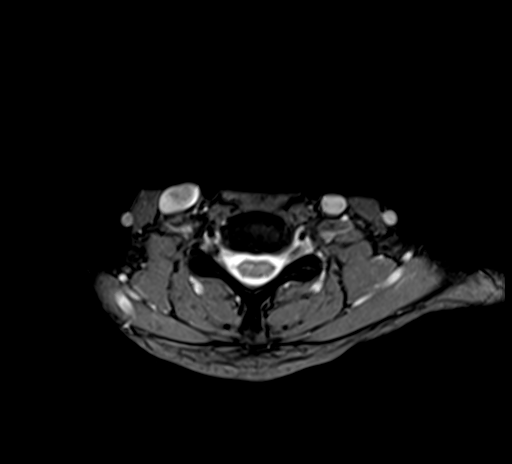
[im 12/30]
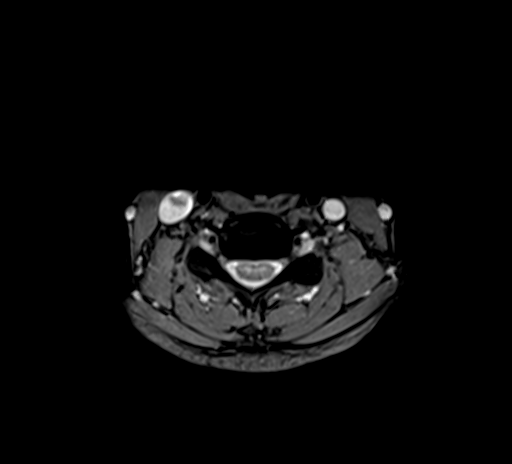
[im 15/30]
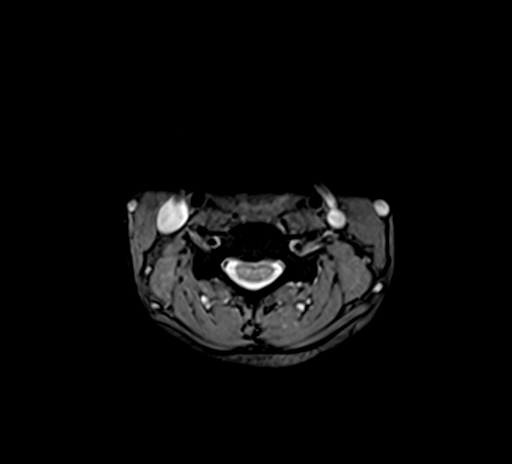
[im 18/30]
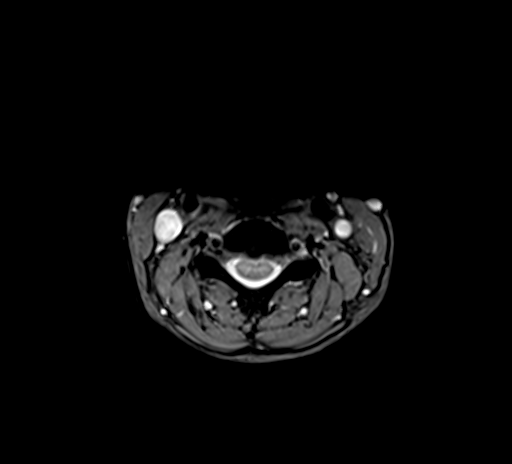
[im 21/30]
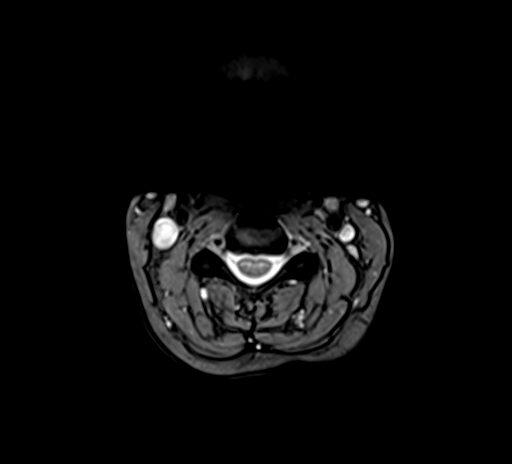
[im 24/30]
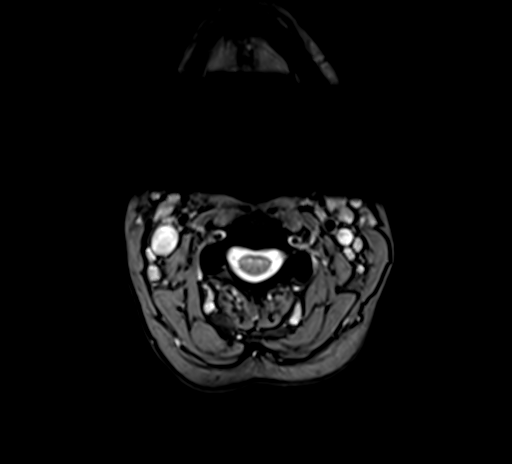
[im 27/30]
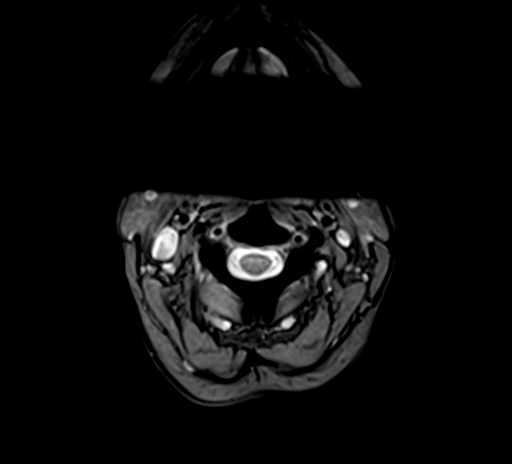
[im 30/30]
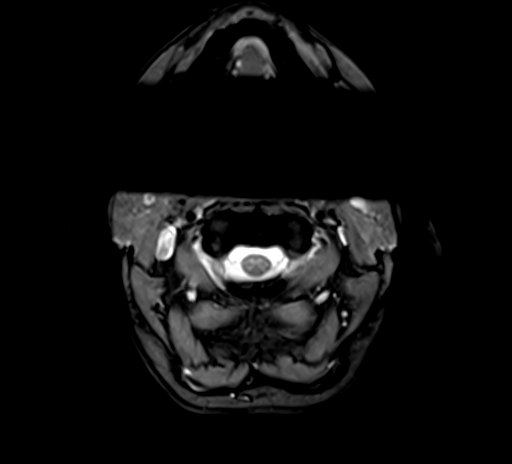

[Series 6: T2 · axial · 3.0mm · 0.62mm/px · z∈[-76,+23]mm · 11 of 29 slices shown (2 of 2)]
[im 1/29]
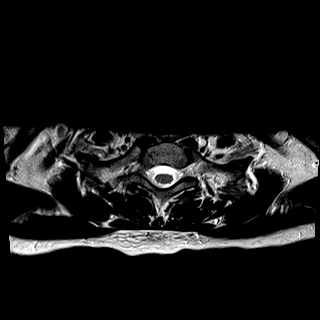
[im 3/29]
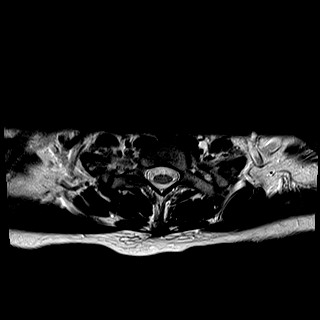
[im 6/29]
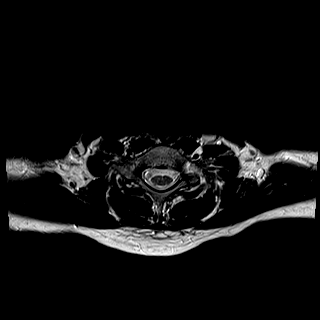
[im 9/29]
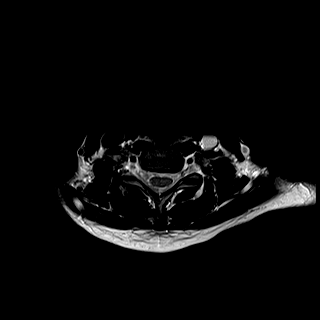
[im 12/29]
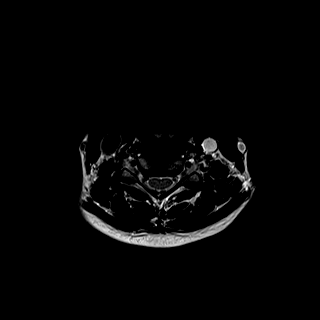
[im 15/29]
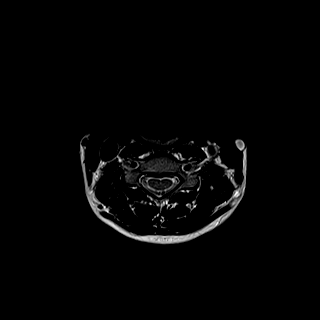
[im 17/29]
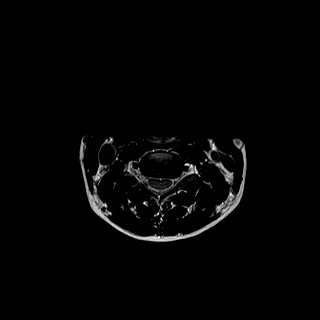
[im 20/29]
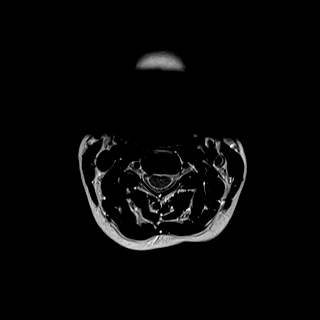
[im 23/29]
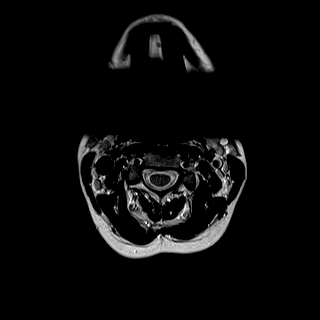
[im 26/29]
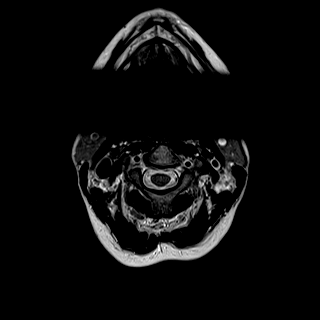
[im 29/29]
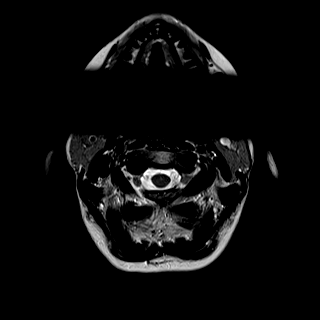

[Series 7: STIR · sagittal · 3.0mm · 0.82mm/px · 5 of 14 slices shown (2 of 2)]
[im 1/14]
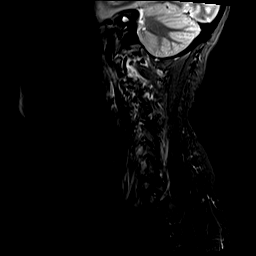
[im 4/14]
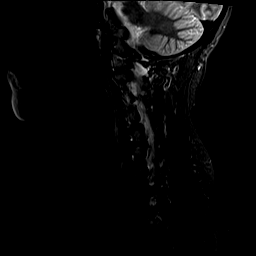
[im 7/14]
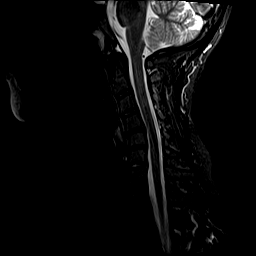
[im 10/14]
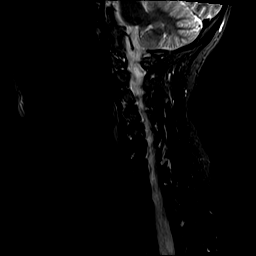
[im 14/14]
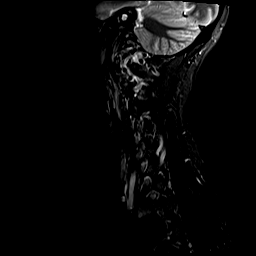

[33 of 48 positions shown; findings below may reference images not displayed]

FINDINGS: Alignment: Slight degenerative anterolisthesis is present at C4-5.
No other significant listhesis is present. Straightening and some
reversal of normal cervical lordosis is noted.

Vertebrae: Marrow signal and vertebral body heights are normal.

Cord: Normal signal and morphology.

Posterior Fossa, vertebral arteries, paraspinal tissues:
Craniocervical junction is normal. Flow is present in the vertebral
arteries bilaterally. Visualized intracranial contents are normal.

Disc levels:

C1-2: Negative.

C2-3: Negative.

C3-4: Negative.

C4-5: Broad-based disc osteophyte complex is asymmetric to the left.
Partial effacement of ventral CSF is noted. Mild left foraminal
narrowing is present.

C5-6: A broad-based disc osteophyte complex present. Uncovertebral
spurring contributes 2 mild foraminal narrowing bilaterally, right
greater than left. Partial effacement of the ventral CSF is noted.

C6-7: Asymmetric left-sided uncovertebral spurring is present
without significant stenosis.

C7-T1: Negative.
IMPRESSION: 1. Mild left foraminal narrowing at C4-5.
2. Mild foraminal narrowing bilaterally at C5-6 is worse on the
right.
3. Asymmetric left-sided uncovertebral spurring without significant
stenosis at C6-7.
4. Straightening and some reversal of the normal cervical lordosis.
This is nonspecific, but can be seen in the setting of muscle strain
or ongoing pain.

## 2019-08-23 MED ORDER — ACETAMINOPHEN 500 MG PO TABS
1000.0000 mg | ORAL_TABLET | Freq: Once | ORAL | Status: AC
  Administered 2019-08-23: 1000 mg via ORAL
  Filled 2019-08-23: qty 2

## 2019-08-23 NOTE — ED Triage Notes (Addendum)
Pt states she has been falling a lot due to nerve damage in her neck. She fell yesterday. Bruising and swelling noted to L foot, abrasion to L elbow.

## 2019-08-23 NOTE — Telephone Encounter (Signed)
Referral to neurosurgeon placed. 

## 2019-08-23 NOTE — Discharge Instructions (Addendum)
Fracture care There is evidence of foot fractures on the x-ray. Pain:  Antiinflammatory medications: May continue to take the diclofenac, as directed. Acetaminophen (generic for Tylenol): Should you continue to have additional pain while taking the diclofenac, you may add in acetaminophen as needed. Your daily total maximum amount of acetaminophen from all sources should be limited to 4000mg /day for persons without liver problems, or 2000mg /day for those with liver problems. Ice: May apply ice to the injured area for no more than 15 minutes at a time to reduce swelling and pain. Elevation: Keep the extremity elevated whenever possible to reduce swelling and pain. Boot: Keep the boot clean and dry.  May walk, as tolerated, placing weight on the heel.  Follow-up: Follow-up with the orthopedic specialist for any further management of this issue.  Call the number provided to set up an appointment. Return: Return to the emergency department for severely increased pain, numbness, blanching of the skin, or any other major concerns.  The most important thing is that you stay safe! Despite the above advice, do not do any actions that would cause you to be unsteady or unsafe.
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
## 2019-08-25 ENCOUNTER — Telehealth: Payer: Self-pay | Admitting: Family Medicine

## 2019-08-25 ENCOUNTER — Telehealth: Payer: Self-pay | Admitting: Medical

## 2019-08-25 NOTE — Telephone Encounter (Signed)
Patient can keep date of appointment as scheduled.   Rosemarie Ax, MD [facility] 08/25/2019, 3:44 PM

## 2019-08-25 NOTE — Telephone Encounter (Signed)
Patient called to give an update. She had a fall this past weekend that resulted in three broken bones in her left foot. She is being scheduled with an orthopedic surgeon to address the fractures. The ED physician recommended her to get an MRI of her brain which she is going to discuss with her PCP tomorrow and possible referral to a neurosurgeon.  Patient is currently scheduled to follow up on July 26th. She would like to know if she should keep this appointment or if you would like to see her before July 26th.

## 2019-08-26 ENCOUNTER — Other Ambulatory Visit: Payer: Self-pay

## 2019-08-26 ENCOUNTER — Ambulatory Visit (INDEPENDENT_AMBULATORY_CARE_PROVIDER_SITE_OTHER): Payer: Medicare Other | Admitting: Medical

## 2019-08-26 VITALS — BP 98/76 | HR 91 | Resp 18 | Ht 64.0 in | Wt 130.0 lb

## 2019-08-26 DIAGNOSIS — M542 Cervicalgia: Secondary | ICD-10-CM

## 2019-08-26 DIAGNOSIS — S92902A Unspecified fracture of left foot, initial encounter for closed fracture: Secondary | ICD-10-CM | POA: Diagnosis not present

## 2019-08-26 MED ORDER — TRAMADOL HCL 50 MG PO TABS: 50.0000 mg | ORAL_TABLET | Freq: Four times a day (QID) | ORAL | 0 refills | Status: AC | PRN

## 2019-08-26 MED ORDER — METHOCARBAMOL 500 MG PO TABS: ORAL_TABLET | ORAL | 0 refills | Status: DC

## 2019-08-26 MED ORDER — DICLOFENAC SODIUM 75 MG PO TBEC: 75.0000 mg | DELAYED_RELEASE_TABLET | Freq: Two times a day (BID) | ORAL | 0 refills | Status: DC

## 2019-08-26 NOTE — Progress Notes (Signed)
Subjective:    Patient ID: Sharon Bolton, female    DOB: 12/25/1975, 44 y.o.   MRN: [id number]  HPI  Pt has hx of lower extremity foot fracture.     A/P from ED. Patient presents with injury to the left foot.  No evidence of acute neurovascular compromise.   3 metatarsal fractures noted on x-ray.  I personally reviewed and interpreted the patient's x-rays. Orthopedic follow-up. I extensively counseled the patient on safety and stability while using the boot and crutches.  She will integrate these precautions into her daily living. The patient was given instructions for home care as well as return precautions. Patient voices understanding of these instructions, accepts the plan, and is comfortable with discharge.  Pt presentls today in boot.  Pt states she is in severe pain at times.  Hx of neck pain. Some spurs and bulging disc on mri. Pt has some radiating pain to upper extremity.   Review of Systems  Constitutional: Negative for chills, fatigue and fever.  Respiratory: Negative for cough, chest tightness, shortness of breath and wheezing.   Cardiovascular: Negative for chest pain and palpitations.  Gastrointestinal: Negative for abdominal pain.  Musculoskeletal: Positive for neck pain.       Foot pain  Skin: Negative for rash.  Neurological: Negative for dizziness, speech difficulty, weakness, numbness and headaches.  Hematological: Negative for adenopathy. Does not bruise/bleed easily.  Psychiatric/Behavioral: Negative for behavioral problems and confusion.    Past Medical History:  Diagnosis Date  . Allergy   . Anxiety   . Asthma   . Depression   . GERD (gastroesophageal reflux disease)   . Seizures ([place])      Social History   Socioeconomic History  . Marital status: Single    Spouse name: Not on file  . Number of children: Not on file  . Years of education: Not on file  . Highest education level: Not on file  Occupational History  . Not on file    Tobacco Use  . Smoking status: Current Every Day Smoker    Packs/day: 1.00    Years: 25.00    Pack years: 25.00    Types: Cigarettes  . Smokeless tobacco: Never Used  Substance and Sexual Activity  . Alcohol use: Not Currently  . Drug use: Not Currently  . Sexual activity: Not Currently  Other Topics Concern  . Not on file  Social History Narrative  . Not on file   Social Determinants of Health   Financial Resource Strain:   . Difficulty of Paying Living Expenses:   Food Insecurity:   . Worried About Charity fundraiser in the Last Year:   . Arboriculturist in the Last Year:   Transportation Needs:   . Film/video editor (Medical):   Marland Kitchen Lack of Transportation (Non-Medical):   Physical Activity:   . Days of Exercise per Week:   . Minutes of Exercise per Session:   Stress:   . Feeling of Stress :   Social Connections:   . Frequency of Communication with Friends and Family:   . Frequency of Social Gatherings with Friends and Family:   . Attends Religious Services:   . Active Member of Clubs or Organizations:   . Attends Archivist Meetings:   Marland Kitchen Marital Status:   Intimate Partner Violence:   . Fear of Current or Ex-Partner:   . Emotionally Abused:   Marland Kitchen Physically Abused:   . Sexually Abused:  Past Surgical History:  Procedure Laterality Date  . ABDOMINAL HYSTERECTOMY      No family history on file.  Allergies  Allergen Reactions  . Sulfa Antibiotics Hives and Swelling    Infancy    Current Outpatient Medications on File Prior to Visit  Medication Sig Dispense Refill  . albuterol (VENTOLIN HFA) 108 (90 Base) MCG/ACT inhaler Inhale 2 puffs into the lungs every 6 (six) hours as needed for wheezing or shortness of breath.     Marland Kitchen atomoxetine (STRATTERA) 80 MG capsule Take 80 mg by mouth daily.    . cephALEXin (KEFLEX) 500 MG capsule Take 1 capsule (500 mg total) by mouth 2 (two) times daily. 20 capsule 0  . clonazePAM (KLONOPIN) 1 MG tablet Take 1  mg by mouth in the morning, at noon, and at bedtime.    . diclofenac (VOLTAREN) 75 MG EC tablet Take 1 tablet (75 mg total) by mouth 2 (two) times daily. 20 tablet 0  . famotidine (PEPCID) 20 MG tablet Take 1 tablet (20 mg total) by mouth daily. 30 tablet 3  . FLUoxetine (PROZAC) 20 MG tablet Take 20 mg by mouth daily.    . fluticasone (FLONASE) 50 MCG/ACT nasal spray Place 2 sprays into both nostrils daily.    Marland Kitchen gabapentin (NEURONTIN) 400 MG capsule Take 400 mg by mouth 2 (two) times daily.    Marland Kitchen levETIRAcetam (KEPPRA) 500 MG tablet Take 500 mg by mouth 2 (two) times daily.    Marland Kitchen levocetirizine (XYZAL) 5 MG tablet Take 1 tablet (5 mg total) by mouth every evening. 30 tablet 3  . methocarbamol (ROBAXIN) 500 MG tablet TAKE 1 TABLET BY MOUTH AT BEDTIME AS NEEDED FOR  NECK  PAIN 10 tablet 0  . metroNIDAZOLE (FLAGYL) 500 MG tablet Take 1 tablet (500 mg total) by mouth 3 (three) times daily. 30 tablet 0  . omeprazole (PRILOSEC) 40 MG capsule Take 1 capsule (40 mg total) by mouth daily. 30 capsule 3  . predniSONE (RAYOS) 5 MG TBEC Take 2 tablets by mouth at bedtime.    Marland Kitchen PRESCRIPTION MEDICATION Apply 1 application topically as needed. lidocane 1% cream    . tinidazole (TINDAMAX) 500 MG tablet 4 tab po x 1 dose 4 tablet 0   No current facility-administered medications on file prior to visit.    BP 98/76 (BP Location: Right Arm, Patient Position: Sitting, Cuff Size: Normal)   Pulse 91   Resp 18   Ht 5\' 4"  (1.626 m)   Wt 130 lb (59 kg)   SpO2 100%   BMI 22.31 kg/m       Objective:   Physical Exam  General- No acute distress. Pleasant patient. Neck- Full range of motion, no jvd Lungs- Clear, even and unlabored. Heart- regular rate and rhythm. Neurologic- CNII- XII grossly intact.  Left foot- in a boot presently.        Assessment & Plan:  For left foot fracture see orthopedist. Recommend that you call them back again by tomorrow afternoon and let me know if you don't get appointment.  Can refer to new ortho.  Refill diclofenac. Very limited rx tramadol. Not to use clonazepam at same time. Plan not to refill but only to help get thru acute post new fracture. rx advisment given.  For neck pain and finding on mri referral to neurosurgeon. Can send in your refill of robaxin.  Follow up 3 weeks or as needed.  Mackie Pai, PA-C   Time spent with patient today  was  30 minutes which consisted of chart review, discussing diagnosis, work up, treatment, answering questions and documentation.

## 2019-08-26 NOTE — Patient Instructions (Addendum)
For left foot fracture see orthopedist. Recommend that you call them back again by tomorrow afternoon and let me know if you don't get appointment. Can refer to new ortho.  Refill diclofenac. Very limited rx tramadol. Not to use clonazepam at same time. Plan not to refill but only to help get thru acute post new fracture. rx advisment given.  For neck pain and finding on mri referral to neurosurgeon. Can send in your refill of robaxin.  Follow up 3 weeks or as needed.

## 2019-08-27 ENCOUNTER — Encounter: Payer: Medicare Other | Admitting: Physical Therapy

## 2019-08-28 ENCOUNTER — Telehealth: Payer: Self-pay | Admitting: Medical

## 2019-08-28 ENCOUNTER — Ambulatory Visit: Payer: Medicare Other | Admitting: Gastroenterology

## 2019-08-28 NOTE — Telephone Encounter (Signed)
Caller : Sharon Bolton  Call Back # [phone]  Patient states that her appointment to see [facility] was cancelled for August 10,2021.  Patient states she has since r/s appointment to 09/30  however, patient states that she can not wait that long to see provider . Patient requesting that Mackie Pai will resend referral as urgent and explained the reason why patient needs to be seen urgently. Patient has an appointment tomorrow with Neuro Surgery.

## 2019-08-29 ENCOUNTER — Telehealth: Payer: Self-pay

## 2019-08-29 NOTE — Telephone Encounter (Signed)
Patient called stated she would like more tramadol or increase dosage  because 8 pills wont last her until her appointment on 7/6 or another medication , and she stated she doesn't mind putting the klonopin on hold in order to get a stronger medication for her foot pain.

## 2019-08-29 NOTE — Telephone Encounter (Signed)
Patient states that she needs a stronger  Medication for pain.

## 2019-08-30 NOTE — Telephone Encounter (Signed)
On review pt cancelled her appointment with neurologist in August then they rescheduled her for sept. Now she wants me to expidite referral. Reason for referral is hx of seizures. I can't guarantee quicker appointment. Ask her  when your last seizure was. This is important factor for neurology office determining when you may need to be seen.   Also she states she needs stronger pain medication. She needs office visit to discuss this. Bit complicated. I can't prescribe long term narcotic with her being on clonazepam. She stated in message she could come off of clonazepam while using pain med. I don't think that is good option. She is on high dose clonazepam historically and discontinuation can lead to seizure.  Also she is relatively new pt. Who is prescribing her clonazepam.

## 2019-09-01 ENCOUNTER — Encounter: Payer: Medicare Other | Admitting: Physical Therapy

## 2019-09-01 NOTE — Telephone Encounter (Signed)
Called pt , didn't leave a voicemail , she stated she doesn't check her vm due to her situation. I  will call back later in the day.

## 2019-09-01 NOTE — Telephone Encounter (Signed)
Patient states last seizure was a year ago, stated she has 2 recent ones undocumented & she no longer needs a stronger dosage of tramadol because she got a earlier appointment this week instead of waiting 2 weeks.

## 2019-09-03 ENCOUNTER — Telehealth: Payer: Self-pay | Admitting: Medical

## 2019-09-03 ENCOUNTER — Encounter: Payer: Medicare Other | Admitting: Physical Therapy

## 2019-09-03 ENCOUNTER — Ambulatory Visit (INDEPENDENT_AMBULATORY_CARE_PROVIDER_SITE_OTHER): Payer: Medicare Other | Admitting: Nurse Practitioner

## 2019-09-03 ENCOUNTER — Encounter: Payer: Self-pay | Admitting: Nurse Practitioner

## 2019-09-03 VITALS — BP 104/70 | HR 88 | Ht 63.25 in | Wt 131.4 lb

## 2019-09-03 DIAGNOSIS — K5900 Constipation, unspecified: Secondary | ICD-10-CM | POA: Diagnosis not present

## 2019-09-03 DIAGNOSIS — G40909 Epilepsy, unspecified, not intractable, without status epilepticus: Secondary | ICD-10-CM

## 2019-09-03 DIAGNOSIS — K219 Gastro-esophageal reflux disease without esophagitis: Secondary | ICD-10-CM | POA: Diagnosis not present

## 2019-09-03 DIAGNOSIS — R1310 Dysphagia, unspecified: Secondary | ICD-10-CM | POA: Diagnosis not present

## 2019-09-03 DIAGNOSIS — M79672 Pain in left foot: Secondary | ICD-10-CM | POA: Insufficient documentation

## 2019-09-03 NOTE — Telephone Encounter (Signed)
New referral to neurologist placed hoping she can get in sooner per request.
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
## 2019-09-03 NOTE — Patient Instructions (Signed)
If you are age 44 or older, your body mass index should be between 23-30. Your Body mass index is 23.09 kg/m. If this is out of the aforementioned range listed, please consider follow up with your Primary Care Provider.  If you are age 71 or younger, your body mass index should be between 19-25. Your Body mass index is 23.09 kg/m. If this is out of the aformentioned range listed, please consider follow up with your Primary Care Provider.   1. Omeprazole 40 mg 1 tablet twice a day 30 minutes before breakfast and dinner. 2. Continue famotidine at bedtime. 3. Stop Diclofenac 4. Take Gaviscon 1 tablespoon three times a day as needed. 5. Call if symptoms are worse 6. Follow up with Dr Lyndel Safe on 10/15/2019 at 2:10 pm  7. Take Miralax 1 capful mixed in 8 ounces of water at bed time for constipation as tolerated. 8. Schedule EGD when Neurology evaluation is complete  _____________________________________________________________________________________________   Sharon Bolton have been scheduled for a Barium Esophogram at [facility] (1st floor of the hospital) on 09/10/2019 at 11:00. Please arrive 15 minutes prior to your appointment for registration. Make certain not to have anything to eat or drink 3 hours prior to your test. If you need to reschedule for any reason, please contact radiology at [phone] to do so. __________________________________________________________________ A barium swallow is an examination that concentrates on views of the esophagus. This tends to be a double contrast exam (barium and two liquids which, when combined, create a gas to distend the wall of the oesophagus) or single contrast (non-ionic iodine based). The study is usually tailored to your symptoms so a good history is essential. Attention is paid during the study to the form, structure and configuration of the esophagus, looking for functional disorders (such as aspiration, dysphagia, achalasia, motility and  reflux) EXAMINATION You may be asked to change into a gown, depending on the type of swallow being performed. A radiologist and radiographer will perform the procedure. The radiologist will advise you of the type of contrast selected for your procedure and direct you during the exam. You will be asked to stand, sit or lie in several different positions and to hold a small amount of fluid in your mouth before being asked to swallow while the imaging is performed .In some instances you may be asked to swallow barium coated marshmallows to assess the motility of a solid food bolus. The exam can be recorded as a digital or video fluoroscopy procedure. POST PROCEDURE It will take 1-2 days for the barium to pass through your system. To facilitate this, it is important, unless otherwise directed, to increase your fluids for the next 24-48hrs and to resume your normal diet.  This test typically takes about 30 minutes to perform. __________________________________________________________________________________  Due to recent changes in healthcare laws, you may see the results of your imaging and laboratory studies on MyChart before your provider has had a chance to review them.  We understand that in some cases there may be results that are confusing or concerning to you. Not all laboratory results come back in the same time frame and the provider may be waiting for multiple results in order to interpret others.  Please give [place] 48 hours in order for your provider to thoroughly review all the results before contacting the office for clarification of your results.   Thank you for choosing [facility] Noralyn Pick, CRNP

## 2019-09-03 NOTE — Telephone Encounter (Signed)
Put new referral to neurologist for seizures. Asking her to be seen sooner.when was her last seizure? Did anyone witness.

## 2019-09-04 ENCOUNTER — Telehealth: Payer: Self-pay | Admitting: Nurse Practitioner

## 2019-09-04 ENCOUNTER — Telehealth: Payer: Self-pay | Admitting: Medical

## 2019-09-04 ENCOUNTER — Other Ambulatory Visit: Payer: Self-pay | Admitting: Medical

## 2019-09-04 MED ORDER — OMEPRAZOLE 40 MG PO CPDR
40.0000 mg | DELAYED_RELEASE_CAPSULE | Freq: Every day | ORAL | 0 refills | Status: DC
Start: 2019-09-04 — End: 2019-10-15

## 2019-09-04 NOTE — Telephone Encounter (Signed)
rx for Omeprazole 40mg  1 bid sent to pharmacy per patient request

## 2019-09-04 NOTE — Telephone Encounter (Signed)
Patient wanted to let Sharon Bolton know that her Neurology appt was moved to 09/10/19 instead of September.
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
## 2019-09-04 NOTE — Progress Notes (Signed)
Agree with plan RG 

## 2019-09-05 NOTE — Telephone Encounter (Signed)
Called patient to provide patient with the central scheduling number so that she can reschedule her appointment at her convenience, vm full, unable to lm.

## 2019-09-09 ENCOUNTER — Encounter: Payer: Medicare Other | Admitting: Physical Therapy

## 2019-09-09 NOTE — Telephone Encounter (Signed)
Pt's case manager is returning Beth's phone call and would like a call back.

## 2019-09-09 NOTE — Telephone Encounter (Signed)
Called her a third time. Patient answers. She is going to the neurologist tomorrow. She will call us back to reschedule the radiology appointment once she has neurology recommendations. Radiology notified.

## 2019-09-09 NOTE — Telephone Encounter (Signed)
Called x2. No answer. Voicemail is full and cannot accept messages. 

## 2019-09-10 ENCOUNTER — Encounter: Payer: Self-pay | Admitting: Neurology

## 2019-09-10 ENCOUNTER — Ambulatory Visit (HOSPITAL_COMMUNITY): Payer: Medicare Other

## 2019-09-10 ENCOUNTER — Ambulatory Visit (INDEPENDENT_AMBULATORY_CARE_PROVIDER_SITE_OTHER): Payer: Medicare Other | Admitting: Neurology

## 2019-09-10 ENCOUNTER — Other Ambulatory Visit: Payer: Self-pay

## 2019-09-10 VITALS — BP 111/73 | HR 97 | Ht 63.25 in | Wt 133.6 lb

## 2019-09-10 DIAGNOSIS — G629 Polyneuropathy, unspecified: Secondary | ICD-10-CM

## 2019-09-10 DIAGNOSIS — G44309 Post-traumatic headache, unspecified, not intractable: Secondary | ICD-10-CM

## 2019-09-10 DIAGNOSIS — R413 Other amnesia: Secondary | ICD-10-CM

## 2019-09-10 DIAGNOSIS — R569 Unspecified convulsions: Secondary | ICD-10-CM
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
## 2019-09-11 ENCOUNTER — Encounter: Payer: Medicare Other | Admitting: Physical Therapy

## 2019-09-12 ENCOUNTER — Other Ambulatory Visit: Payer: Self-pay | Admitting: Medical

## 2019-09-12 NOTE — Telephone Encounter (Signed)
Pt is calling to reschedule appt.  Patient called stating her Neurologist said it would be fine to be sedated. Sharon Bolton

## 2019-09-13 NOTE — Telephone Encounter (Signed)
Saw neurologist notes. Psychiatrist may be prescribing gabapentin. So will presently hold off on muscle relaxers due to both having sedation side effects.g

## 2019-09-15 ENCOUNTER — Other Ambulatory Visit: Payer: Self-pay

## 2019-09-15 ENCOUNTER — Ambulatory Visit (INDEPENDENT_AMBULATORY_CARE_PROVIDER_SITE_OTHER): Payer: Medicare Other | Admitting: Neurology

## 2019-09-15 DIAGNOSIS — R569 Unspecified convulsions: Secondary | ICD-10-CM

## 2019-09-15 DIAGNOSIS — G629 Polyneuropathy, unspecified: Secondary | ICD-10-CM

## 2019-09-15 DIAGNOSIS — G44309 Post-traumatic headache, unspecified, not intractable: Secondary | ICD-10-CM

## 2019-09-15 DIAGNOSIS — R413 Other amnesia: Secondary | ICD-10-CM

## 2019-09-15 NOTE — Telephone Encounter (Signed)
Patient called on EEG results. Voiced understood an aware.

## 2019-09-15 NOTE — Procedures (Signed)
ELECTROENCEPHALOGRAM REPORT  Date of Study: 09/15/2019  Patient's Name: Sharon Bolton MRN: [id number] Date of Birth: [birth year]  Referring Provider: Dr. Ellouise Newer  Clinical History: This is a 44 year old woman with a history of seizures, gaps in time, memory loss.  Medications: KEPPRA 500 MG tablet KLONOPIN 1 MG tablet NEURONTIN 400 MG capsule VENTOLIN HFA 108 (90 Base) MCG/ACT inhaler STRATTERA 80 MG capsule ANTACID 750 MG chewable tablet PROZAC 20 MG tablet FLONASE 50 MCG/ACT nasal spray NORCO/VICODIN 5-325 MG tablet XYZAL 5 MG tablet PRILOSEC 40 MG capsule  Technical Summary: A multichannel digital 1-hour EEG recording measured by the international 10-20 system with electrodes applied with paste and impedances below 5000 ohms performed in our laboratory with EKG monitoring in an awake and asleep patient.  Hyperventilation was not performed. Photic stimulation was performed.  The digital EEG was referentially recorded, reformatted, and digitally filtered in a variety of bipolar and referential montages for optimal display.    Description: The patient is awake and asleep during the recording.  During maximal wakefulness, there is a symmetric, medium voltage 12 Hz posterior dominant rhythm that attenuates with eye opening.  The record is symmetric.  There is an excess amount of diffuse low voltage beta activity seen throughout the recording. During drowsiness and sleep, there is an increase in theta slowing of the background. Vertex waves and symmetric sleep spindles were seen. Photic stimulation did not elicit any abnormalities.  There were no epileptiform discharges or electrographic seizures seen.    EKG lead was unremarkable.  Impression: This 1-hour awake and asleep EEG is normal except for excess amount of diffuse low voltage beta activity.  Clinical Correlation: Diffuse low voltage beta activity is commonly seen with sedating medications such as benzodiazepines.  In the  absence of sedating medications, anxiety and hyperthyroidism may produce generalized beta activity.  The absence of epileptiform discharges does not exclude a clinical diagnosis of epilepsy.  If further clinical questions remain, prolonged EEG may be helpful.  Clinical correlation is advised.   Ellouise Newer, M.D.

## 2019-09-16 NOTE — Telephone Encounter (Signed)
DG esophagus scheduled. She has a follow up appointment with Dr Lyndel Safe on 10/15/19 which she will keep unless there is change of plans.

## 2019-09-17 ENCOUNTER — Telehealth: Payer: Self-pay | Admitting: Medical

## 2019-09-17 DIAGNOSIS — S92313A Displaced fracture of first metatarsal bone, unspecified foot, initial encounter for closed fracture: Secondary | ICD-10-CM | POA: Insufficient documentation

## 2019-09-17 NOTE — Telephone Encounter (Signed)
Patient called regarding [facility], states they have a new person dealing with the referrals , and to ask Dr. Harvie Heck if he can re send it . Please advise

## 2019-09-18 NOTE — Telephone Encounter (Signed)
Issue with referral to neurosurgery info not going thru. Will you see Sharon Bolton not. Resend referral. Thanks.

## 2019-09-22 ENCOUNTER — Ambulatory Visit: Payer: Medicare Other | Admitting: Medical

## 2019-09-22 ENCOUNTER — Ambulatory Visit (INDEPENDENT_AMBULATORY_CARE_PROVIDER_SITE_OTHER): Payer: Medicare Other | Admitting: Medical

## 2019-09-22 ENCOUNTER — Other Ambulatory Visit (HOSPITAL_COMMUNITY)
Admission: RE | Admit: 2019-09-22 | Discharge: 2019-09-22 | Disposition: A | Discharge status: Home / Self Care | Payer: Medicare Other | Source: Ambulatory Visit | Attending: Medical | Admitting: Medical

## 2019-09-22 ENCOUNTER — Other Ambulatory Visit: Payer: Self-pay

## 2019-09-22 VITALS — BP 90/62 | HR 65 | Temp 98.0°F | Resp 18 | Ht 63.0 in | Wt 135.0 lb

## 2019-09-22 DIAGNOSIS — N898 Other specified noninflammatory disorders of vagina: Secondary | ICD-10-CM

## 2019-09-22 DIAGNOSIS — G40909 Epilepsy, unspecified, not intractable, without status epilepticus: Secondary | ICD-10-CM | POA: Diagnosis not present

## 2019-09-22 DIAGNOSIS — M542 Cervicalgia: Secondary | ICD-10-CM

## 2019-09-22 DIAGNOSIS — S92902A Unspecified fracture of left foot, initial encounter for closed fracture: Secondary | ICD-10-CM

## 2019-09-22 DIAGNOSIS — R7989 Other specified abnormal findings of blood chemistry: Secondary | ICD-10-CM

## 2019-09-22 DIAGNOSIS — Z202 Contact with and (suspected) exposure to infections with a predominantly sexual mode of transmission: Secondary | ICD-10-CM

## 2019-09-22 DIAGNOSIS — I959 Hypotension, unspecified: Secondary | ICD-10-CM

## 2019-09-22 DIAGNOSIS — Z113 Encounter for screening for infections with a predominantly sexual mode of transmission: Secondary | ICD-10-CM | POA: Insufficient documentation

## 2019-09-22 DIAGNOSIS — Z83438 Family history of other disorder of lipoprotein metabolism and other lipidemia: Secondary | ICD-10-CM | POA: Diagnosis not present

## 2019-09-22 DIAGNOSIS — M5410 Radiculopathy, site unspecified: Secondary | ICD-10-CM

## 2019-09-22 LAB — LIPID PANEL
Cholesterol: 181 mg/dL (ref 0–200)
HDL: 58.1 mg/dL (ref >39.00)
LDL Cholesterol: 111 mg/dL — ABNORMAL HIGH (ref 0–99)
NonHDL: 123.14
Total CHOL/HDL Ratio: 3
Triglycerides: 59 mg/dL (ref 0.0–149.0)
VLDL: 11.8 mg/dL (ref 0.0–40.0)

## 2019-09-22 LAB — COMPREHENSIVE METABOLIC PANEL
ALT: 14 U/L (ref 0–35)
AST: 11 U/L (ref 0–37)
Albumin: 4.2 g/dL (ref 3.5–5.2)
Alkaline Phosphatase: 47 U/L (ref 39–117)
BUN: 14 mg/dL (ref 6–23)
CO2: 30 mEq/L (ref 19–32)
Calcium: 9.4 mg/dL (ref 8.4–10.5)
Chloride: 106 mEq/L (ref 96–112)
Creatinine, Ser: 0.64 mg/dL (ref 0.40–1.20)
GFR: 100.7 mL/min (ref >60.00)
Glucose, Bld: 80 mg/dL (ref 70–99)
Potassium: 4.4 mEq/L (ref 3.5–5.1)
Sodium: 140 mEq/L (ref 135–145)
Total Bilirubin: 0.4 mg/dL (ref 0.2–1.2)
Total Protein: 6.4 g/dL (ref 6.0–8.3)

## 2019-09-22 LAB — CBC WITH DIFFERENTIAL/PLATELET
Basophils Absolute: 0.1 10*3/uL (ref 0.0–0.1)
Basophils Relative: 0.9 % (ref 0.0–3.0)
Eosinophils Absolute: 0.1 10*3/uL (ref 0.0–0.7)
Eosinophils Relative: 1.9 % (ref 0.0–5.0)
HCT: 39.7 % (ref 36.0–46.0)
Hemoglobin: 13.3 g/dL (ref 12.0–15.0)
Lymphocytes Relative: 37.7 % (ref 12.0–46.0)
Lymphs Abs: 2.6 10*3/uL (ref 0.7–4.0)
MCHC: 33.4 g/dL (ref 30.0–36.0)
MCV: 96.2 fl (ref 78.0–100.0)
Monocytes Absolute: 0.6 10*3/uL (ref 0.1–1.0)
Monocytes Relative: 9.3 % (ref 3.0–12.0)
Neutro Abs: 3.5 10*3/uL (ref 1.4–7.7)
Neutrophils Relative %: 50.2 % (ref 43.0–77.0)
Platelets: 320 10*3/uL (ref 150.0–400.0)
RBC: 4.13 Mil/uL (ref 3.87–5.11)
RDW: 13.7 % (ref 11.5–15.5)
WBC: 6.9 10*3/uL (ref 4.0–10.5)

## 2019-09-22 LAB — TSH: TSH: 1.04 u[IU]/mL (ref 0.35–4.50)

## 2019-09-22 LAB — T4, FREE: Free T4: 0.73 ng/dL (ref 0.60–1.60)

## 2019-09-22 MED ORDER — FLUCONAZOLE 150 MG PO TABS: 150.0000 mg | ORAL_TABLET | Freq: Once | ORAL | 0 refills | Status: AC

## 2019-09-22 NOTE — Patient Instructions (Signed)
For foot fracture continue to follow up with your orthopedist.  For neck pain and radicular pain continue with gabapentin. Also follow up with neurosurgeon. I resent referral message.  For hx of seizure continue on keppra and follow up with neurologist.   Continue to see behavioral health for ptsd.  You may have yeast infection. Rx diflucan x 1 day. Get urine ancillary studies and repeat hiv.  Follow up one month or as needed
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
## 2019-09-23 ENCOUNTER — Telehealth: Payer: Self-pay | Admitting: Medical

## 2019-09-23 LAB — URINE CYTOLOGY ANCILLARY ONLY
Bacterial Vaginitis-Urine: POSITIVE — AB
Candida Urine: NEGATIVE
Chlamydia: NEGATIVE
Comment: NEGATIVE
Comment: NEGATIVE
Comment: NORMAL
Neisseria Gonorrhea: NEGATIVE
Trichomonas: POSITIVE — AB

## 2019-09-23 LAB — HIV ANTIBODY (ROUTINE TESTING W REFLEX): HIV 1&2 Ab, 4th Generation: NONREACTIVE

## 2019-09-23 MED ORDER — METRONIDAZOLE 500 MG PO TABS
500.0000 mg | ORAL_TABLET | Freq: Three times a day (TID) | ORAL | 0 refills | Status: DC
Start: 2019-09-23 — End: 2019-10-15

## 2019-09-23 NOTE — Telephone Encounter (Signed)
Appears not yeast infection since not responding to diflucan. Will follow ancillary studies.

## 2019-09-23 NOTE — Telephone Encounter (Signed)
Patient states seen Sharon Bolton medication for one pill . Patient states medication did not take away all symptoms.   Please Advise

## 2019-09-23 NOTE — Telephone Encounter (Signed)
R flagyl sent to pt pharmacy.

## 2019-09-24 ENCOUNTER — Ambulatory Visit (HOSPITAL_COMMUNITY)
Admission: RE | Admit: 2019-09-24 | Discharge: 2019-09-24 | Disposition: A | Discharge status: Home / Self Care | Payer: Medicare Other | Source: Ambulatory Visit | Attending: Nurse Practitioner | Admitting: Nurse Practitioner

## 2019-09-24 ENCOUNTER — Other Ambulatory Visit: Payer: Self-pay

## 2019-09-24 DIAGNOSIS — K5900 Constipation, unspecified: Secondary | ICD-10-CM | POA: Insufficient documentation

## 2019-09-24 DIAGNOSIS — R1310 Dysphagia, unspecified: Secondary | ICD-10-CM | POA: Insufficient documentation

## 2019-09-24 DIAGNOSIS — K219 Gastro-esophageal reflux disease without esophagitis: Secondary | ICD-10-CM | POA: Insufficient documentation

## 2019-09-24 IMAGING — RF DG ESOPHAGUS
9 series · 19 of 24 positions shown · non-contrast
Comparison: None.

CLINICAL DATA: Gastroesophageal reflux disease.  Dysphagia.

EXAM:
ESOPHOGRAM / BARIUM SWALLOW / BARIUM TABLET STUDY
TECHNIQUE: Combined double contrast and single contrast examination performed
using effervescent crystals, thick barium liquid, and thin barium
liquid. The patient was observed with fluoroscopy swallowing a 13 mm
barium sulphate tablet.
FLUOROSCOPY TIME:  Fluoroscopy Time:  2 minutes and 42 seconds
Radiation Exposure Index (if provided by the fluoroscopic device):
22.8 mGy
Number of Acquired Spot Images: 0

[Series 1: cp_standard · 0.34mm/px · 2 of 51 frames shown (1 of 9)]
[frame 8/51]
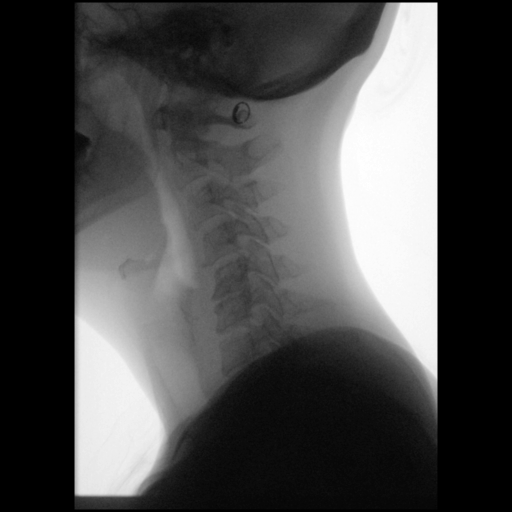
[frame 26/51]
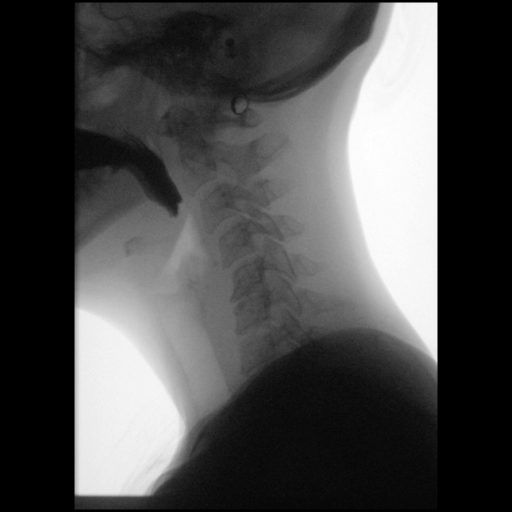

[Series 2: cp_standard · 0.51mm/px · 2 of 193 frames shown (2 of 9)]
[frame 29/193]
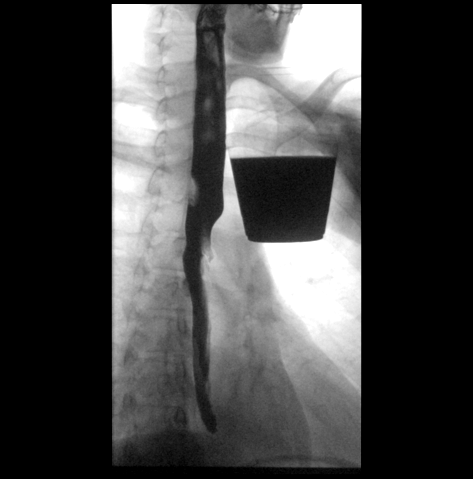
[frame 97/193]
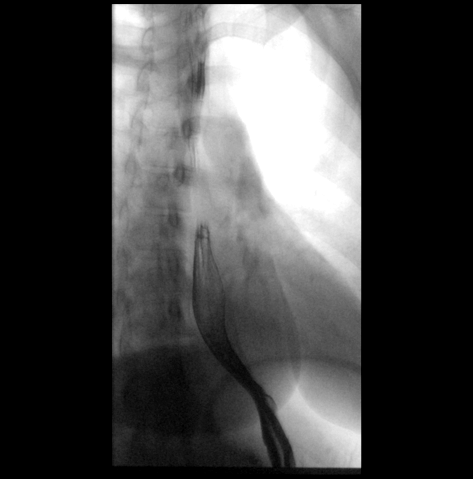

[Series 3: cp_standard · 0.51mm/px · 2 of 179 frames shown (3 of 9)]
[frame 27/179]
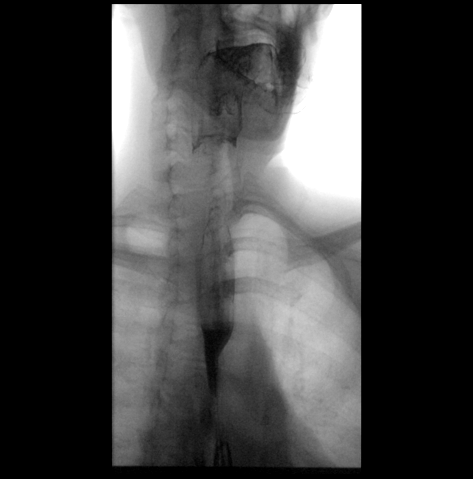
[frame 90/179]
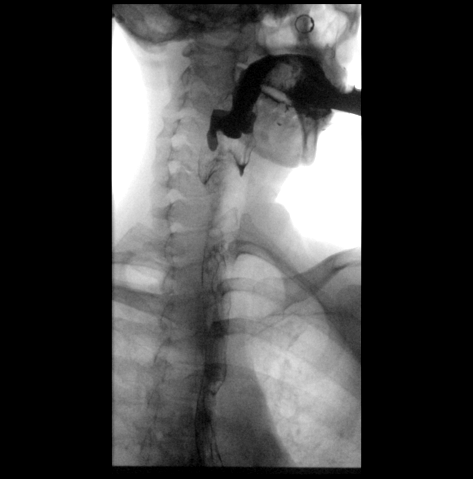

[Series 4: cp_standard · 0.51mm/px · 3 of 148 frames shown (4 of 9)]
[frame 23/148]
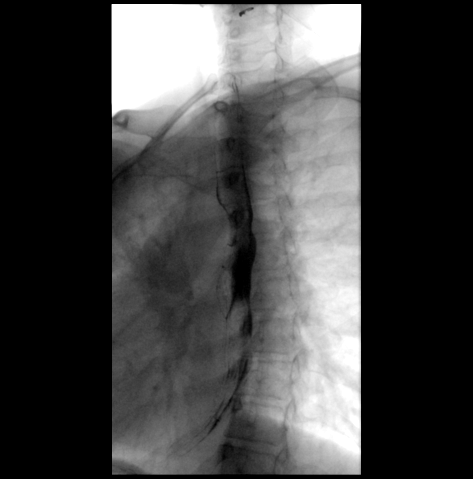
[frame 75/148]
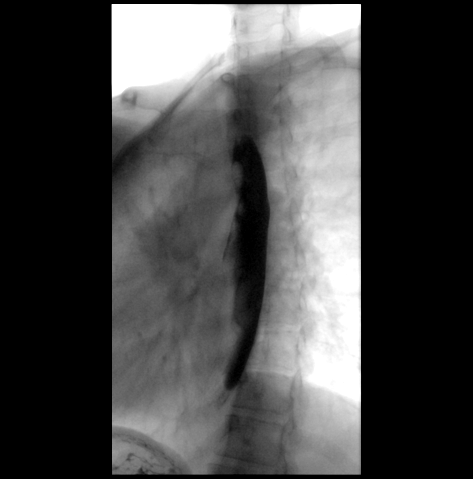
[frame 126/148]
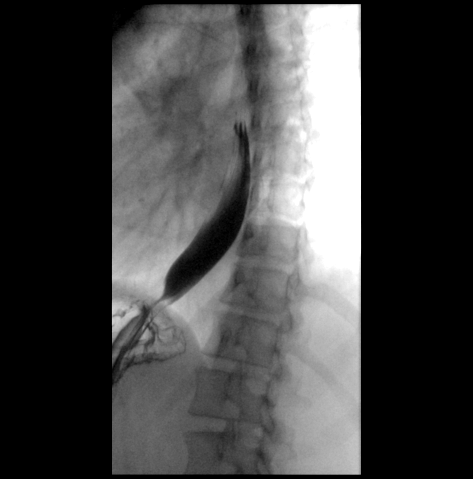

[Series 5: cp_standard · 0.51mm/px · 2 of 170 frames shown (5 of 9)]
[frame 86/170]
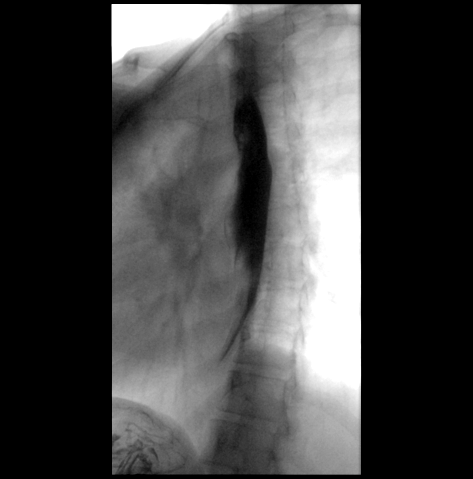
[frame 167/170]
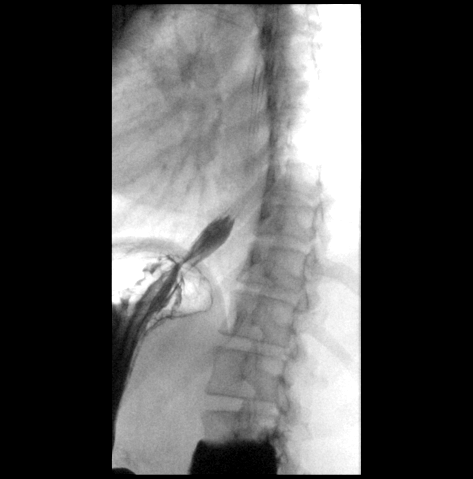

[Series 6: cp_standard · 0.51mm/px · 2 of 215 frames shown (6 of 9)]
[frame 33/215]
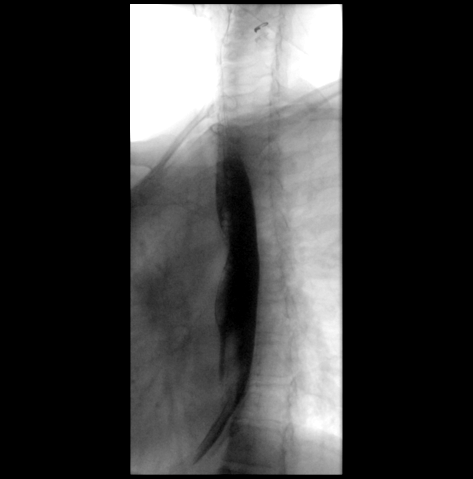
[frame 108/215]
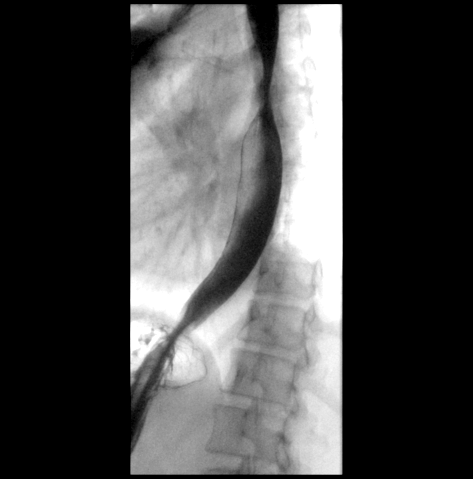

[Series 7: cp_standard · 0.51mm/px · 3 of 75 frames shown (7 of 9)]
[frame 12/75]
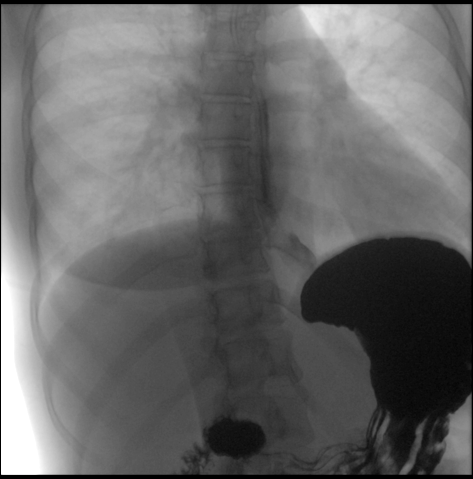
[frame 32/75]
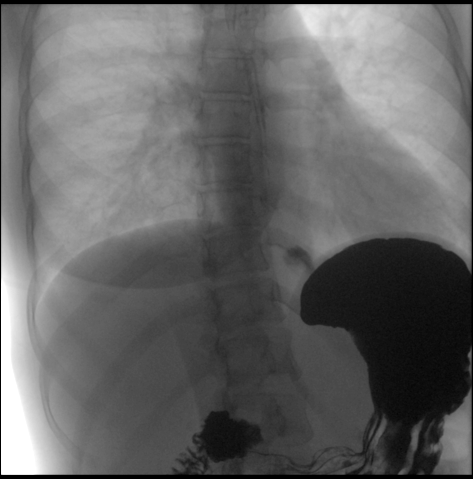
[frame 64/75]
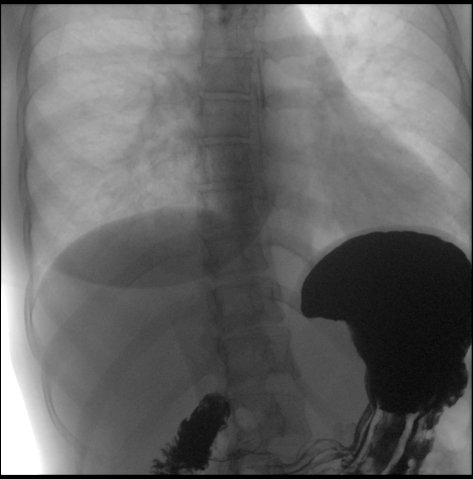

[Series 8: cp_standard · 0.51mm/px · 2 of 165 frames shown (8 of 9)]
[frame 25/165]
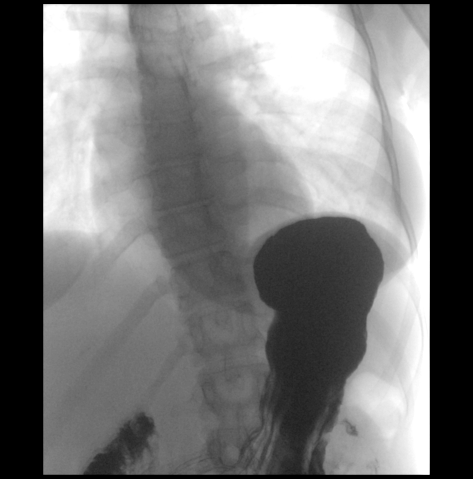
[frame 149/165]
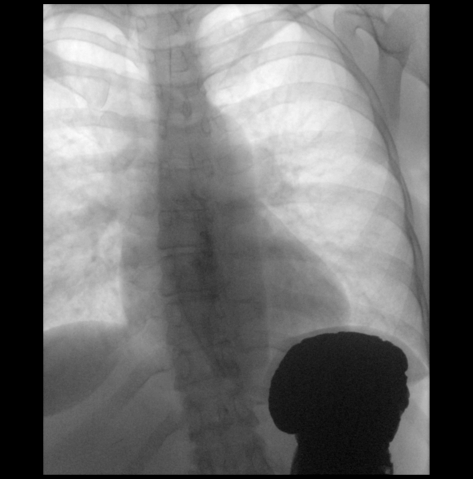

[Series 9: cp_standard · 0.26mm/px · 1 of 1 slices shown (9 of 9)]
[im 1/1]
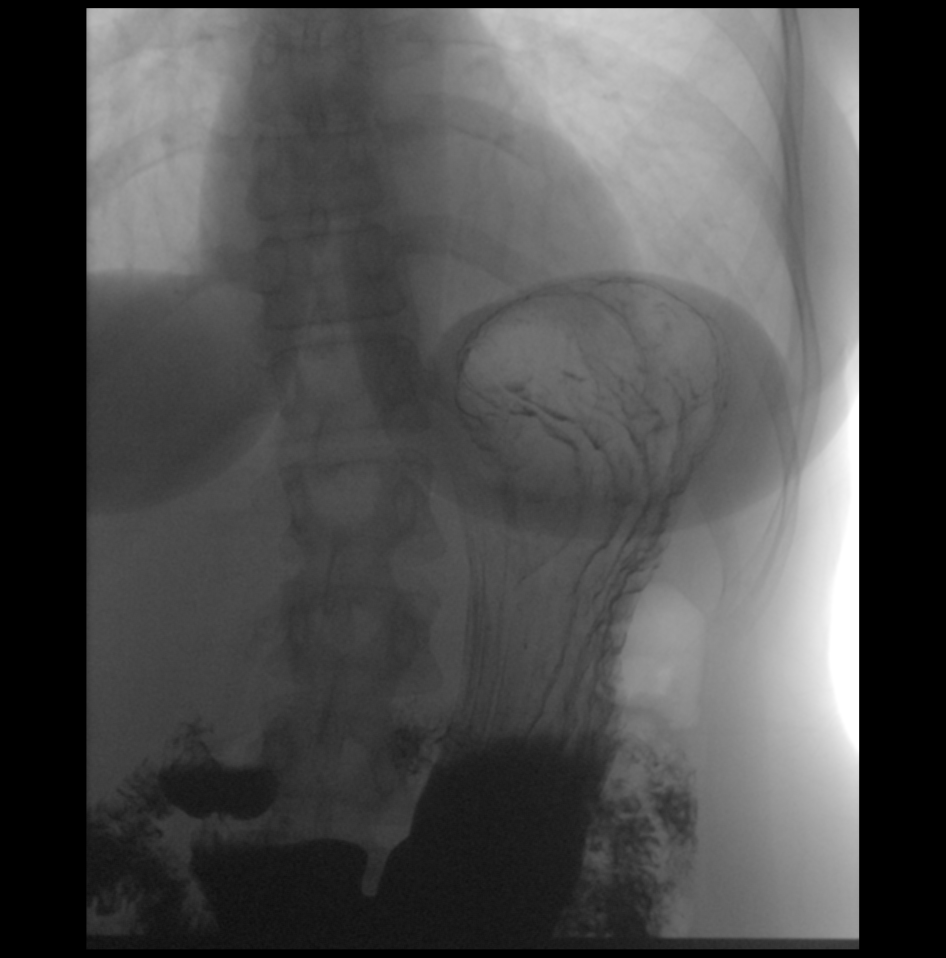

[19 of 24 positions shown; findings below may reference images not displayed]

FINDINGS: Hypopharyngeal portion of the exam is unremarkable. Note is made of
age advanced spondylosis at C5-6.

Double contrast evaluation of the esophagus demonstrates no mucosal
abnormality.

Evaluation of primary peristalsis demonstrates a normal primary
peristaltic wave on each swallow.

Full column evaluation of the esophagus demonstrates no persistent
narrowing or stricture.

Mild spontaneous gastroesophageal reflux is identified with water
swallows.

A 13 mm barium tablet passes promptly.
IMPRESSION: Mild gastroesophageal reflux with water swallows. Otherwise, normal
esophagram.

## 2019-09-29 ENCOUNTER — Ambulatory Visit: Payer: Self-pay

## 2019-09-29 ENCOUNTER — Encounter: Payer: Self-pay | Admitting: Family Medicine

## 2019-09-29 ENCOUNTER — Other Ambulatory Visit: Payer: Self-pay

## 2019-09-29 ENCOUNTER — Ambulatory Visit (INDEPENDENT_AMBULATORY_CARE_PROVIDER_SITE_OTHER): Payer: Medicare Other | Admitting: Family Medicine

## 2019-09-29 VITALS — BP 104/71 | HR 78 | Ht 63.0 in | Wt 130.0 lb

## 2019-09-29 DIAGNOSIS — M899 Disorder of bone, unspecified: Secondary | ICD-10-CM | POA: Diagnosis not present

## 2019-09-29 DIAGNOSIS — S63642A Sprain of metacarpophalangeal joint of left thumb, initial encounter: Secondary | ICD-10-CM | POA: Insufficient documentation

## 2019-09-29 DIAGNOSIS — S5332XA Traumatic rupture of left ulnar collateral ligament, initial encounter: Secondary | ICD-10-CM | POA: Insufficient documentation

## 2019-09-29 DIAGNOSIS — S63649A Sprain of metacarpophalangeal joint of unspecified thumb, initial encounter: Secondary | ICD-10-CM | POA: Insufficient documentation

## 2019-09-29 DIAGNOSIS — M79645 Pain in left finger(s): Secondary | ICD-10-CM

## 2019-09-29 DIAGNOSIS — G44329 Chronic post-traumatic headache, not intractable: Secondary | ICD-10-CM | POA: Insufficient documentation

## 2019-09-29 NOTE — Assessment & Plan Note (Signed)
Pain could be associated with the abnormal motion of the scapula as she does have changes observed on MRI of the cervical spine.  No winging obviously noted.  Has myofascial type pain.  She is going to be started on Cymbalta by her psychiatrist. -Counseled on home exercise therapy and supportive care. -Can resume physical therapy for this issue. -Could consider trigger point injections.

## 2019-09-29 NOTE — Patient Instructions (Signed)
Good to see you Please try the duexis for headaches  Please try voltaren over the counter for the thumb.  Please let me know if you need another referral to physical therapy   Please send me a message in MyChart with any questions or updates.  Please see me back in 6 weeks.   --Dr. Raeford Razor
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
## 2019-09-29 NOTE — Progress Notes (Signed)
Medication Samples have been provided to the patient. name: Duexis       Strength: 800mg /26.6mg         Qty: 2 boxes LOT: [id number]  Exp.Date: 06/2020  Dosing instructions: take 1 tablet by mouth three (3) times a day.  The patient has been instructed regarding the correct time, dose, and frequency of taking this medication, including desired effects and most common side effects.   Sherrie George, [place] 11:36 AM 09/29/2019
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
## 2019-10-01 ENCOUNTER — Telehealth: Payer: Self-pay | Admitting: Medical

## 2019-10-01 MED ORDER — FLUTICASONE PROPIONATE 50 MCG/ACT NA SUSP: 2.0000 | Freq: Every day | NASAL | 2 refills | Status: DC

## 2019-10-01 NOTE — Telephone Encounter (Signed)
New message:   Pt is calling and would like to know if Dr. Harvie Heck would be willing to take over prescribing fluticasone (FLONASE) 50 MCG/ACT nasal spray for her. Please advise.  Sharon Bolton

## 2019-10-01 NOTE — Telephone Encounter (Signed)
Rx sent 

## 2019-10-05 ENCOUNTER — Emergency Department
Admission: RE | Admit: 2019-10-05 | Discharge: 2019-10-05 | Disposition: A | Discharge status: Home / Self Care | Payer: Medicare Other | Source: Ambulatory Visit | Attending: Neurology | Admitting: Neurology

## 2019-10-05 ENCOUNTER — Emergency Department (HOSPITAL_COMMUNITY)
Admission: EM | Admit: 2019-10-05 | Discharge: 2019-10-05 | Discharge status: Against Medical Advice | Payer: Medicare Other

## 2019-10-05 DIAGNOSIS — G629 Polyneuropathy, unspecified: Secondary | ICD-10-CM

## 2019-10-05 DIAGNOSIS — R569 Unspecified convulsions: Secondary | ICD-10-CM

## 2019-10-05 DIAGNOSIS — G44309 Post-traumatic headache, unspecified, not intractable: Secondary | ICD-10-CM

## 2019-10-05 DIAGNOSIS — R413 Other amnesia: Secondary | ICD-10-CM

## 2019-10-05 IMAGING — MR MR HEAD WO/W CM
12 of 14 series · 34 of 48 positions shown · IV contrast (multihance)
Comparison: None available.

CLINICAL DATA: Initial evaluation for seizure, memory loss,
epilepsy, balance issues, headaches.

EXAM:
MRI HEAD WITHOUT AND WITH CONTRAST
TECHNIQUE: Multiplanar, multiecho pulse sequences of the brain and surrounding
structures were obtained without and with intravenous contrast.
CONTRAST:  12mL MULTIHANCE GADOBENATE DIMEGLUMINE 529 MG/ML IV SOLN

[Series 2: T1 · sagittal · 5.0mm · 0.45mm/px · 1 of 21 slices shown]
[im 1/21]
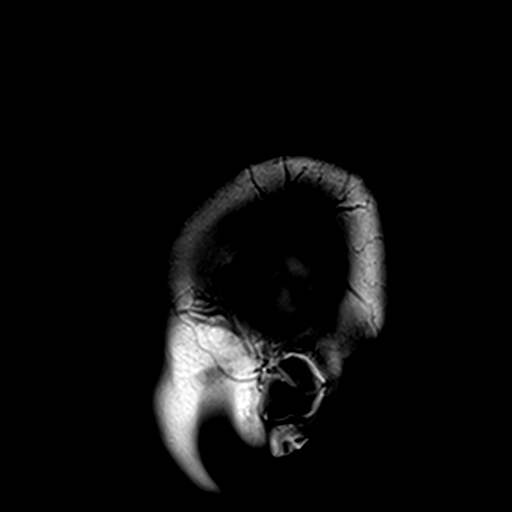

[Series 3: DWI · axial · 3.0mm · 1.80mm/px · z∈[-77,+69]mm · 7 of 100 slices shown (1 of 2)]
[im 1/100]
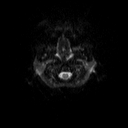
[im 17/100]
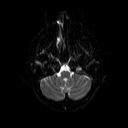
[im 34/100]
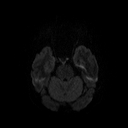
[im 50/100]
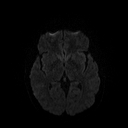
[im 67/100]
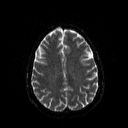
[im 83/100]
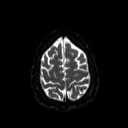
[im 100/100]
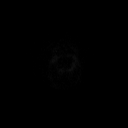

[Series 4: DWI · axial · 3.0mm · 1.80mm/px · z∈[-77,+69]mm · 3 of 43 slices shown (2 of 2)]
[im 1/43]
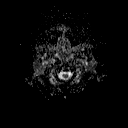
[im 22/43]
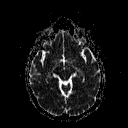
[im 43/43]
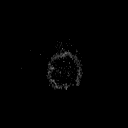

[Series 5: T2 · axial · 5.0mm · 0.51mm/px · 1 of 22 slices shown (1 of 3)]
[im 1/22]
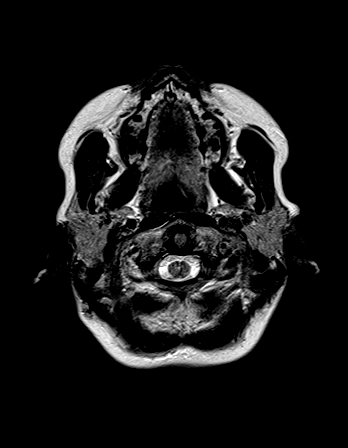

[Series 6: FLAIR · axial · 3.0mm · 0.45mm/px · z∈[-74,+65]mm · 2 of 31 slices shown (1 of 3)]
[im 1/31]
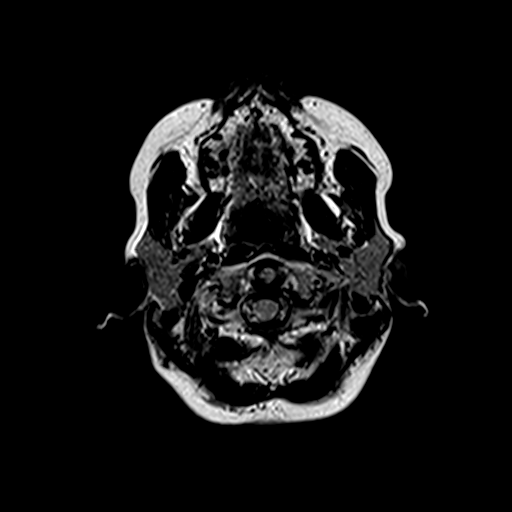
[im 31/31]
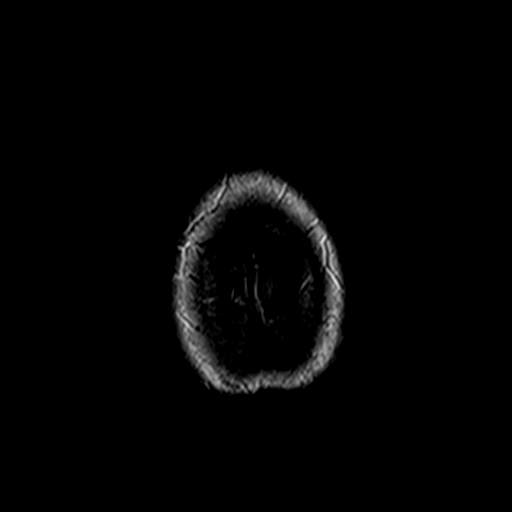

[Series 8: swi_images · axial · 2.0mm · 0.90mm/px · z∈[-74,+67]mm · 5 of 72 slices shown]
[im 1/72]
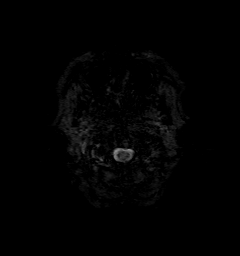
[im 18/72]
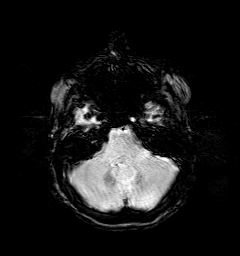
[im 36/72]
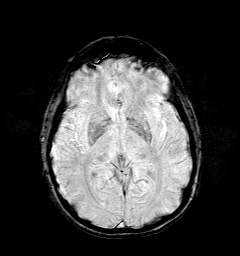
[im 54/72]
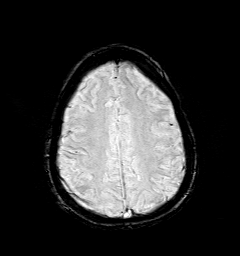
[im 72/72]
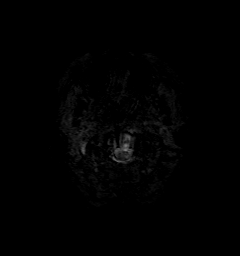

[Series 9: t1_mpr_tra · axial · 2.0mm · 0.45mm/px · z∈[-75,+67]mm · 5 of 72 slices shown]
[im 1/72]
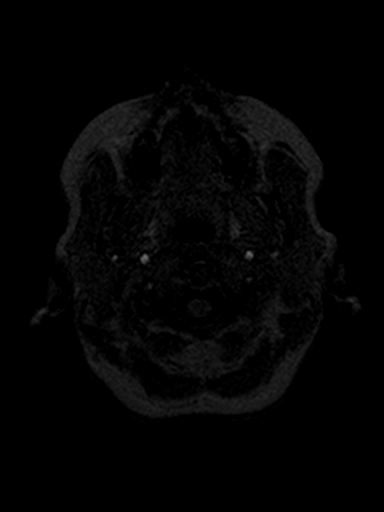
[im 18/72]
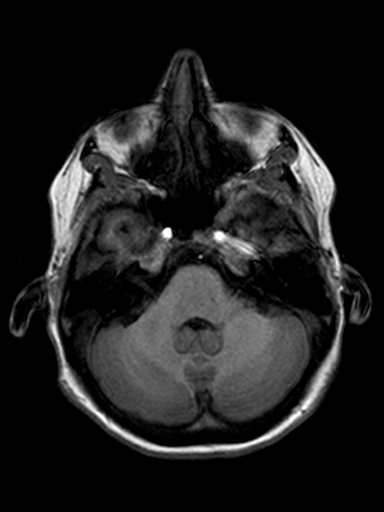
[im 36/72]
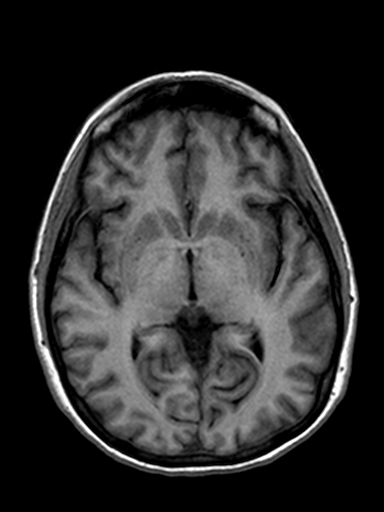
[im 54/72]
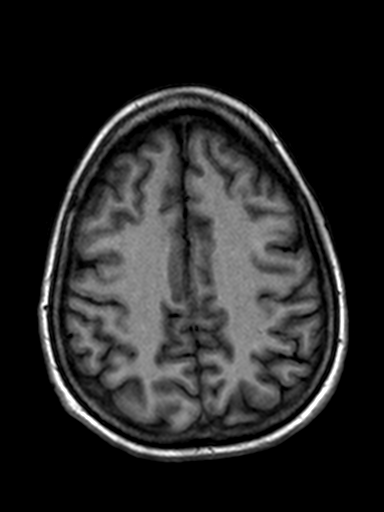
[im 72/72]
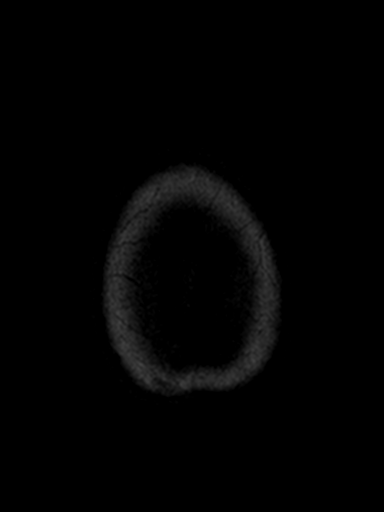

[Series 10: cor reformat · coronal · 2.0mm · 0.45mm/px · 2 of 137 slices shown]
[im 1/137]
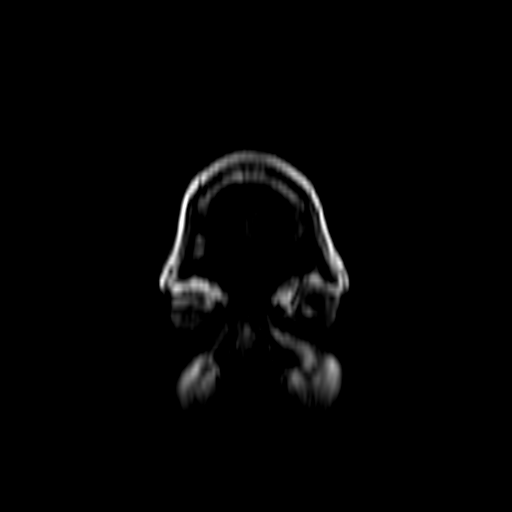
[im 18/137]
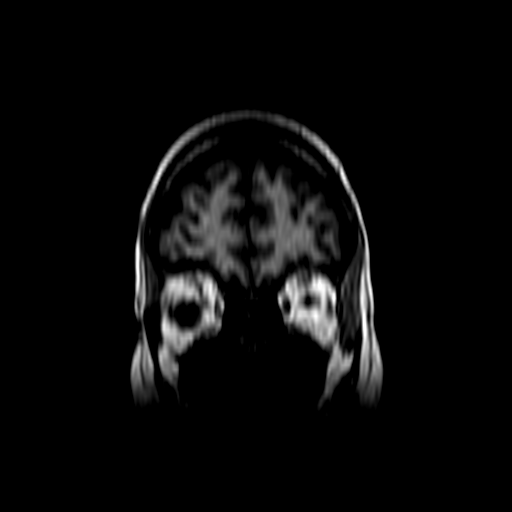

[Series 11: T2 · coronal · 3.0mm · 0.23mm/px · 2 of 28 slices shown (2 of 3)]
[im 1/28]
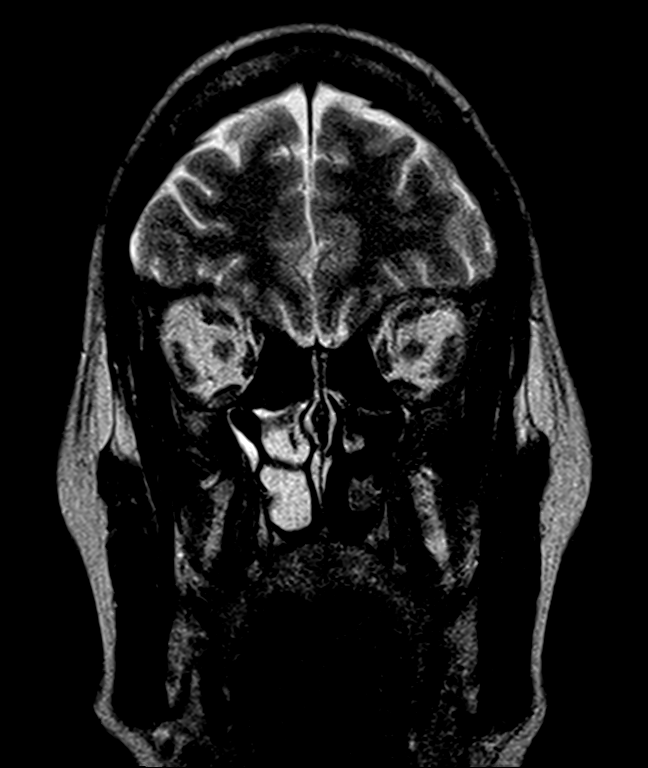
[im 28/28]
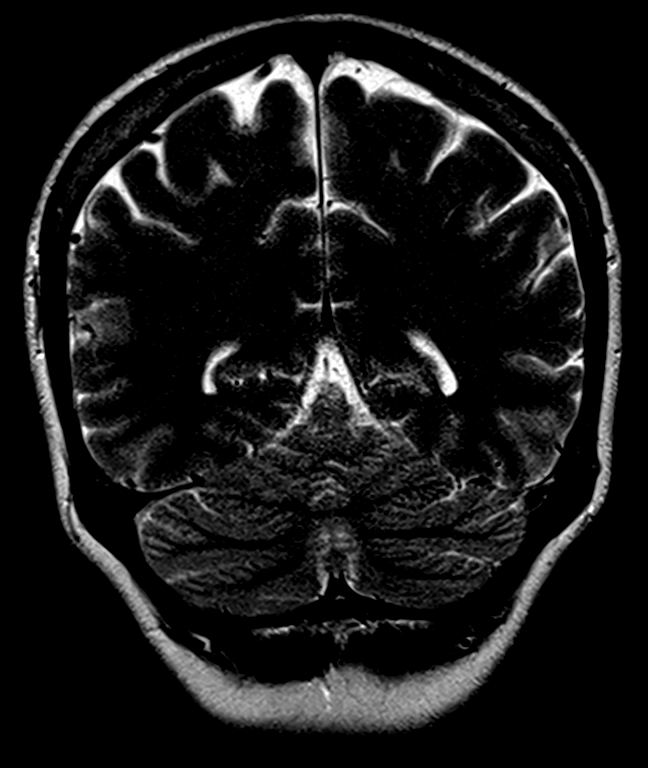

[Series 12: FLAIR · coronal · 3.0mm · 0.70mm/px · 2 of 28 slices shown (2 of 3)]
[im 1/28]
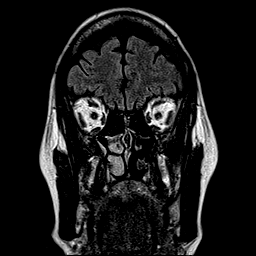
[im 28/28]
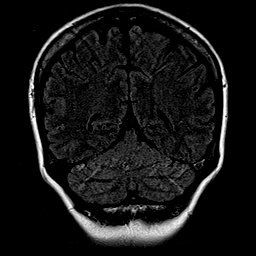

[Series 13: FLAIR · coronal · 3.0mm · 0.70mm/px · 2 of 28 slices shown (3 of 3)]
[im 1/28]
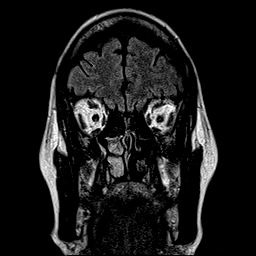
[im 28/28]
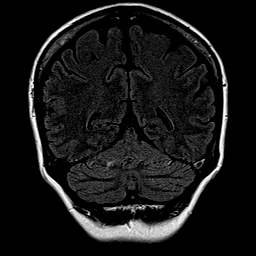

[Series 14: T2 · coronal · 5.0mm · 0.45mm/px · 2 of 26 slices shown (3 of 3)]
[im 1/26]
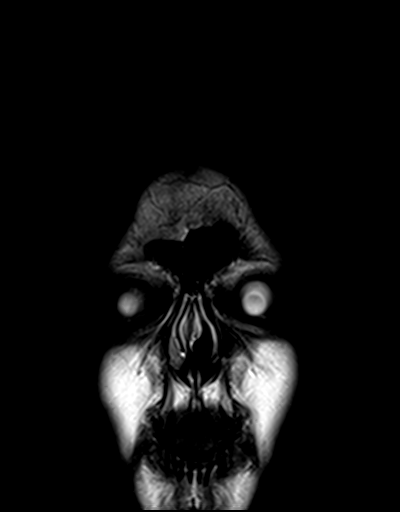
[im 26/26]
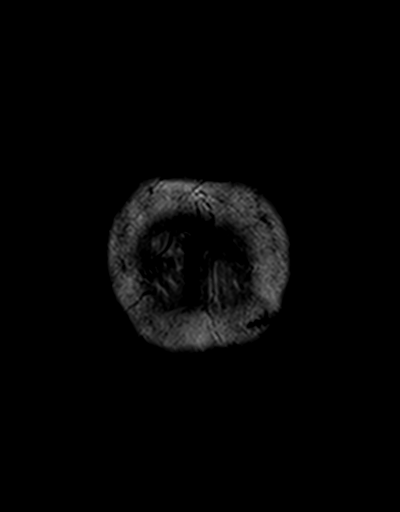

[34 of 48 positions shown; findings below may reference images not displayed]

FINDINGS: Brain: Cerebral volume within normal limits for age. Small focus of
encephalomalacia and gliosis seen involving the cortex of the left
parieto-occipital region (series 6, image 18), nonspecific, but
could be related to prior ischemia or possibly remote trauma. No
associated hemosiderin staining or enhancement.

No other focal parenchymal signal abnormality. Gray-white matter
differentiation otherwise maintained. No abnormal foci of restricted
diffusion to suggest acute or subacute ischemia or changes related
to seizure. No other areas of chronic encephalomalacia. No foci of
susceptibility artifact to suggest acute or chronic intracranial
hemorrhage.

No mass lesion, midline shift or mass effect. Ventricles normal size
without hydrocephalus. No extra-axial fluid collection. Pituitary
gland suprasellar region within normal limits. Midline structures
intact and normal. Thin section imaging through the temporal lobes
demonstrates no intrinsic temporal lobe abnormality. No evidence for
mesial temporal sclerosis.

No abnormal enhancement following contrast administration. Small DVA
noted within the right parietal lobe.

Vascular: Major intracranial vascular flow voids are well
maintained.

Skull and upper cervical spine: Craniocervical junction within
normal limits. Upper cervical spine normal. Bone marrow signal
intensity within normal limits. No scalp soft tissue abnormality.

Sinuses/Orbits: Globes and orbital soft tissues within normal
limits. Paranasal sinuses are largely clear. No mastoid effusion.
Inner ear structures grossly normal.

Other: None.
IMPRESSION: 1. Small focus of encephalomalacia and gliosis involving the cortex
of the left parieto-occipital region, nonspecific, but could be
related to prior ischemia or possibly remote trauma.
2. Otherwise normal brain MRI for age. No acute intracranial
abnormality identified.

## 2019-10-05 MED ORDER — GADOBENATE DIMEGLUMINE 529 MG/ML IV SOLN
12.0000 mL | Freq: Once | INTRAVENOUS | Status: AC | PRN
  Administered 2019-10-05: 12 mL via INTRAVENOUS

## 2019-10-07 ENCOUNTER — Telehealth: Payer: Self-pay

## 2019-10-07 NOTE — Telephone Encounter (Signed)
-----   Message from Cameron Sprang, MD sent at 10/07/2019 12:43 PM EDT ----- Pls let her know the MRI brain did not show any tumor, stroke, or bleed. It showed similar old scar tissue on the left side, as her prior brain scan reported. No new changes. Thanks

## 2019-10-07 NOTE — Telephone Encounter (Signed)
Pt called to go over MRI no answer voice mail left for pt to call back

## 2019-10-08 ENCOUNTER — Telehealth: Payer: Self-pay | Admitting: Neurology

## 2019-10-08 NOTE — Telephone Encounter (Signed)
Patient left message with accessnurse that she missed a call about her MRI results

## 2019-10-08 NOTE — Telephone Encounter (Signed)
Returned pts call and left VM with MRI results per Dr Delice Lesch - MRI brain did not show any tumor, stroke, or bleed. It showed similar old scar tissue on the left side, as her prior brain scan reported. No new changes. Asked her to call back if any further questions.

## 2019-10-09 ENCOUNTER — Telehealth: Payer: Self-pay

## 2019-10-09 NOTE — Telephone Encounter (Signed)
-----   Message from Cameron Sprang, MD sent at 10/07/2019 12:43 PM EDT ----- Pls let her know the MRI brain did not show any tumor, stroke, or bleed. It showed similar old scar tissue on the left side, as her prior brain scan reported. No new changes. Thanks

## 2019-10-09 NOTE — Telephone Encounter (Signed)
Spoke to pt informed her that MRI brain did not show any tumor, stroke, or bleed. It showed similar old scar tissue on the left side, as her prior brain scan reported. No new changes

## 2019-10-09 NOTE — Telephone Encounter (Signed)
Left voicemail to call office back

## 2019-10-14 ENCOUNTER — Ambulatory Visit: Payer: Medicare Other | Admitting: Neurology

## 2019-10-15 ENCOUNTER — Other Ambulatory Visit: Payer: Self-pay

## 2019-10-15 ENCOUNTER — Encounter: Payer: Self-pay | Admitting: Gastroenterology

## 2019-10-15 ENCOUNTER — Ambulatory Visit (INDEPENDENT_AMBULATORY_CARE_PROVIDER_SITE_OTHER): Payer: Medicare Other | Admitting: Gastroenterology

## 2019-10-15 VITALS — BP 106/74 | HR 94 | Ht 63.0 in | Wt 130.0 lb

## 2019-10-15 DIAGNOSIS — K219 Gastro-esophageal reflux disease without esophagitis: Secondary | ICD-10-CM | POA: Diagnosis not present

## 2019-10-15 DIAGNOSIS — R1032 Left lower quadrant pain: Secondary | ICD-10-CM | POA: Diagnosis not present

## 2019-10-15 DIAGNOSIS — K581 Irritable bowel syndrome with constipation: Secondary | ICD-10-CM | POA: Diagnosis not present

## 2019-10-15 MED ORDER — OMEPRAZOLE 40 MG PO CPDR: 40.0000 mg | DELAYED_RELEASE_CAPSULE | Freq: Every day | ORAL | 2 refills | Status: DC

## 2019-10-15 MED ORDER — FAMOTIDINE 20 MG PO TABS: 20.0000 mg | ORAL_TABLET | Freq: Every day | ORAL | 3 refills | Status: DC
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
## 2019-10-15 NOTE — Patient Instructions (Addendum)
If you are age 44 or older, your body mass index should be between 23-30. Your Body mass index is 23.03 kg/m. If this is out of the aforementioned range listed, please consider follow up with your Primary Care Provider.  If you are age 36 or younger, your body mass index should be between 19-25. Your Body mass index is 23.03 kg/m. If this is out of the aformentioned range listed, please consider follow up with your Primary Care Provider.   You have been scheduled for a CT scan of the abdomen and pelvis at [facility].  You are scheduled on 10/17/19 at 9 am. You should arrive 15 minutes prior to your appointment time for registration. Please follow the written instructions below on the day of your exam:  WARNING: IF YOU ARE ALLERGIC TO IODINE/X-RAY DYE, PLEASE NOTIFY RADIOLOGY IMMEDIATELY AT [phone]! YOU WILL BE GIVEN A 13 HOUR PREMEDICATION PREP.  1) Do not eat or drink anything after 5 am (4 hours prior to your test) 2) You have been given 2 bottles of oral contrast to drink. The solution may taste better if refrigerated, but do NOT add ice or any other liquid to this solution. Shake well before drinking.    Drink 1 bottle of contrast @ 7 am (2 hours prior to your exam)  Drink 1 bottle of contrast @ 8 am (1 hour prior to your exam)  You may take any medications as prescribed with a small amount of water, if necessary. If you take any of the following medications: METFORMIN, GLUCOPHAGE, GLUCOVANCE, AVANDAMET, RIOMET, FORTAMET, [place] MET, JANUMET, GLUMETZA or METAGLIP, you MAY be asked to HOLD this medication 48 hours AFTER the exam.  The purpose of you drinking the oral contrast is to aid in the visualization of your intestinal tract. The contrast solution may cause some diarrhea. Depending on your individual set of symptoms, you may also receive an intravenous injection of x-ray contrast/dye. This test typically takes 30-45 minutes to complete.  If you have any questions  regarding your exam or if you need to reschedule, you may call the CT department at [phone]  between the hours of 8:00 am and 5:00 pm, Monday-Friday.  ________________________________________________________________________  Sharon Bolton have been scheduled for an endoscopy and colonoscopy. Please follow the written instructions given to you at your visit today. Please pick up your prep supplies at the pharmacy within the next 1-3 days. If you use inhalers (even only as needed), please bring them with you on the day of your procedure.  We have sent the following medications to your pharmacy for you to pick up at your convenience:  Omeprazole Pepcid  Thank you,  Dr. Jackquline Denmark

## 2019-10-17 ENCOUNTER — Ambulatory Visit (HOSPITAL_BASED_OUTPATIENT_CLINIC_OR_DEPARTMENT_OTHER): Payer: Medicare Other | Attending: Gastroenterology

## 2019-10-23 ENCOUNTER — Other Ambulatory Visit: Payer: Self-pay

## 2019-10-23 ENCOUNTER — Ambulatory Visit (INDEPENDENT_AMBULATORY_CARE_PROVIDER_SITE_OTHER): Payer: Medicare Other | Admitting: Medical

## 2019-10-23 ENCOUNTER — Other Ambulatory Visit (HOSPITAL_COMMUNITY)
Admission: RE | Admit: 2019-10-23 | Discharge: 2019-10-23 | Disposition: A | Discharge status: Home / Self Care | Payer: Medicare Other | Source: Ambulatory Visit | Attending: Medical | Admitting: Medical

## 2019-10-23 VITALS — BP 101/56 | HR 83 | Resp 18 | Ht 63.0 in | Wt 130.8 lb

## 2019-10-23 DIAGNOSIS — G629 Polyneuropathy, unspecified: Secondary | ICD-10-CM | POA: Diagnosis not present

## 2019-10-23 DIAGNOSIS — S92902A Unspecified fracture of left foot, initial encounter for closed fracture: Secondary | ICD-10-CM

## 2019-10-23 DIAGNOSIS — M79645 Pain in left finger(s): Secondary | ICD-10-CM

## 2019-10-23 DIAGNOSIS — Z113 Encounter for screening for infections with a predominantly sexual mode of transmission: Secondary | ICD-10-CM | POA: Insufficient documentation

## 2019-10-23 MED ORDER — AZITHROMYCIN 250 MG PO TABS
ORAL_TABLET | ORAL | 0 refills | Status: DC
Start: 2019-10-23 — End: 2020-01-06

## 2019-10-23 NOTE — Patient Instructions (Addendum)
For hx of std, will get urine ancillary studies repeat to verify infection cleared.  For neuropathy, can continue gabapentin but ask specialist if you can titrate more slowly since severe sedation side effects.  For depression continue cymbalta.  For thumb pain continue with sport med management.  Get second covid vaccine today.  For 2 days st with mild rt side lymph node tender will rx azithromycin. Cover for possible strep.  Follow up 6 weeks or as needed
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
## 2019-10-24 LAB — URINE CYTOLOGY ANCILLARY ONLY
Chlamydia: NEGATIVE
Comment: NEGATIVE
Comment: NEGATIVE
Trichomonas: NEGATIVE

## 2019-10-29 ENCOUNTER — Ambulatory Visit (INDEPENDENT_AMBULATORY_CARE_PROVIDER_SITE_OTHER): Payer: Medicare Other | Admitting: Neurology

## 2019-10-29 ENCOUNTER — Other Ambulatory Visit: Payer: Self-pay

## 2019-10-29 DIAGNOSIS — R569 Unspecified convulsions: Secondary | ICD-10-CM

## 2019-10-29 DIAGNOSIS — R413 Other amnesia: Secondary | ICD-10-CM

## 2019-10-29 DIAGNOSIS — G44309 Post-traumatic headache, unspecified, not intractable: Secondary | ICD-10-CM

## 2019-10-29 DIAGNOSIS — G5601 Carpal tunnel syndrome, right upper limb: Secondary | ICD-10-CM

## 2019-10-29 NOTE — Procedures (Signed)
[facility]  [place]  [place], [place] [address] Tel: [phone] Fax:  [phone] Test Date:  10/29/2019  Patient: Sharon Bolton DOB: 06-04-1975 Physician: Narda Amber, DO  Sex: Female Height: 5\' 3"  Ref Phys: Ellouise Newer, M.D.  ID#: [id number] Temp: 32.0C Technician:    Patient Complaints: This is a 44 year old female referred for evaluation of decreased sensation on the right side of the body.  NCV & EMG Findings: Extensive electrodiagnostic testing of the right upper and lower extremity shows:  1. Right mixed palmar sensory responses show prolonged latency (Median Palm-Ulnar Palm, 0.7 ms).  Right median, ulnar, sural, and superficial peroneal sensory responses are within normal limits.   2. Right median, ulnar, peroneal, and tibial motor responses are within normal limits.   3. Right tibial H reflex study is within normal limits.   4. There is no evidence of active or chronic motor axonal loss changes affecting any of the tested muscles.  Motor unit configuration and recruitment pattern is within normal limits.    Impression: 1. Right median neuropathy at or distal to the wrist (very mild), consistent with a clinical diagnosis of carpal tunnel syndrome.   2. Right ulnar neuropathy with slowing across the elbow, purely demyelinating, very mild.   ___________________________ Narda Amber, DO    Nerve Conduction Studies Anti Sensory Summary Table   Stim Site NR Peak (ms) Norm Peak (ms) P-T Amp (V) Norm P-T Amp  Right Median Anti Sensory (2nd Digit)  32C  Wrist    3.2 <3.4 24.7 >20  Right Sup Peroneal Anti Sensory (Ant Lat Mall)  12 cm    2.1 <4.5 21.2 >5  Right Sural Anti Sensory (Lat Mall)  Calf    2.7 <4.5 25.8 >5  Right Ulnar Anti Sensory (5th Digit)  32C  Wrist    2.4 <3.1 43.1 >12   Motor Summary Table   Stim Site NR Onset (ms) Norm Onset (ms) O-P Amp (mV) Norm O-P Amp Site1 Site2 Delta-0 (ms) Dist (cm) Vel (m/s) Norm Vel (m/s)    Right Median Motor (Abd Poll Brev)  32C  Wrist    3.4 <3.9 8.6 >6 Elbow Wrist 4.6 25.0 54 >50  Elbow    8.0  8.5         Right Peroneal Motor (Ext Dig Brev)  Ankle    3.8 <5.5 3.4 >3 B Fib Ankle 7.5 36.0 48 >40  B Fib    11.3  3.3  Poplt B Fib 1.7 8.0 47 >40  Poplt    13.0  3.3         Right Tibial Motor (Abd Hall Brev)  Ankle    3.0 <6.0 14.7 >8 Knee Ankle 8.4 38.0 45 >40  Knee    11.4  10.7         Right Ulnar Motor (Abd Dig Minimi)  32C  Wrist    2.0 <3.1 8.0 >7 B Elbow Wrist 3.2 20.0 63 >50  B Elbow    5.2  7.7  A Elbow B Elbow 2.1 10.0 48 >50  A Elbow    7.3  6.8          Comparison Summary Table   Stim Site NR Peak (ms) Norm Peak (ms) P-T Amp (V) Site1 Site2 Delta-P (ms) Norm Delta (ms)  Right Median/Ulnar Palm Comparison (Wrist - 8cm)  32C  Median Palm    2.1 <2.2 49.1 Median Palm Ulnar Palm 0.7   Ulnar Palm  1.4 <2.2 15.8       H Reflex Studies   NR H-Lat (ms) Lat Norm (ms) L-R H-Lat (ms)  Right Tibial (Gastroc)     32.52 <35    EMG   Side Muscle Ins Act Fibs Psw Fasc Number Recrt Dur Dur. Amp Amp. Poly Poly. Comment  Right 1stDorInt Nml Nml Nml Nml Nml Nml Nml Nml Nml Nml Nml Nml N/A  Right Abd Poll Brev Nml Nml Nml Nml Nml Nml Nml Nml Nml Nml Nml Nml N/A  Right PronatorTeres Nml Nml Nml Nml Nml Nml Nml Nml Nml Nml Nml Nml N/A  Right Biceps Nml Nml Nml Nml Nml Nml Nml Nml Nml Nml Nml Nml N/A  Right Triceps Nml Nml Nml Nml Nml Nml Nml Nml Nml Nml Nml Nml N/A  Right Deltoid Nml Nml Nml Nml Nml Nml Nml Nml Nml Nml Nml Nml N/A  Right FlexCarpiUln Nml Nml Nml Nml Nml Nml Nml Nml Nml Nml Nml Nml N/A  Right AntTibialis Nml Nml Nml Nml Nml Nml Nml Nml Nml Nml Nml Nml N/A  Right Gastroc Nml Nml Nml Nml Nml Nml Nml Nml Nml Nml Nml Nml N/A  Right Flex Dig Long Nml Nml Nml Nml Nml Nml Nml Nml Nml Nml Nml Nml N/A  Right RectFemoris Nml Nml Nml Nml Nml Nml Nml Nml Nml Nml Nml Nml N/A  Right GluteusMed Nml Nml Nml Nml Nml Nml Nml Nml Nml Nml Nml Nml N/A       Waveforms:

## 2019-11-04 ENCOUNTER — Other Ambulatory Visit: Payer: Self-pay | Admitting: Neurology

## 2019-11-06 ENCOUNTER — Telehealth: Payer: Self-pay

## 2019-11-06 NOTE — Telephone Encounter (Signed)
-----   Message from Cameron Sprang, MD sent at 11/03/2019  1:05 PM EDT ----- Pls let her know the nerve and muscle test showed very mild carpal tunnel syndrome or nerve compression at the wrist and elbow. No evidence of pinched nerve at the neck or back. Thanks

## 2019-11-06 NOTE — Telephone Encounter (Signed)
Called patient and informed her of results. Patient verbalized understanding. 

## 2019-11-13 ENCOUNTER — Ambulatory Visit: Payer: Medicare Other | Admitting: Family Medicine
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
## 2019-11-14 ENCOUNTER — Encounter: Payer: Medicare Other | Admitting: Gastroenterology

## 2019-11-14 ENCOUNTER — Telehealth: Payer: Self-pay | Admitting: Medical

## 2019-11-14 NOTE — Telephone Encounter (Signed)
Called pt and was unable to lvm  

## 2019-11-14 NOTE — Telephone Encounter (Signed)
Per pt she will follow with urgent care

## 2019-11-14 NOTE — Telephone Encounter (Signed)
I think I am booked/schedule full. Can she be seen at other office? If not can she get scheduled. For Monday.

## 2019-11-14 NOTE — Telephone Encounter (Signed)
Caller : [place]  Call Back # [phone]  Patient states urinary complaint, patient states UTI. She would like Percell Miller to send a medication to pharmacy  for treatment.  Please Advise

## 2019-11-24 ENCOUNTER — Ambulatory Visit: Payer: Medicare Other | Admitting: Family Medicine
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
## 2019-12-01 ENCOUNTER — Ambulatory Visit (INDEPENDENT_AMBULATORY_CARE_PROVIDER_SITE_OTHER): Payer: Medicare Other | Admitting: Medical

## 2019-12-01 ENCOUNTER — Other Ambulatory Visit: Payer: Self-pay

## 2019-12-01 VITALS — BP 107/76 | HR 97 | Resp 18 | Ht 63.0 in | Wt 128.0 lb

## 2019-12-01 DIAGNOSIS — M549 Dorsalgia, unspecified: Secondary | ICD-10-CM

## 2019-12-01 DIAGNOSIS — R809 Proteinuria, unspecified: Secondary | ICD-10-CM | POA: Diagnosis not present

## 2019-12-01 DIAGNOSIS — L089 Local infection of the skin and subcutaneous tissue, unspecified: Secondary | ICD-10-CM

## 2019-12-01 DIAGNOSIS — J45909 Unspecified asthma, uncomplicated: Secondary | ICD-10-CM

## 2019-12-01 DIAGNOSIS — Z23 Encounter for immunization: Secondary | ICD-10-CM | POA: Diagnosis not present

## 2019-12-01 DIAGNOSIS — Z87440 Personal history of urinary (tract) infections: Secondary | ICD-10-CM

## 2019-12-01 LAB — COMPREHENSIVE METABOLIC PANEL
AG Ratio: 1.9 (calc) (ref 1.0–2.5)
ALT: 9 U/L (ref 6–29)
AST: 14 U/L (ref 10–30)
Albumin: 4 g/dL (ref 3.6–5.1)
Alkaline phosphatase (APISO): 46 U/L (ref 31–125)
BUN: 9 mg/dL (ref 7–25)
CO2: 28 mmol/L (ref 20–32)
Calcium: 9.2 mg/dL (ref 8.6–10.2)
Chloride: 105 mmol/L (ref 98–110)
Creat: 0.66 mg/dL (ref 0.50–1.10)
Globulin: 2.1 g/dL (calc) (ref 1.9–3.7)
Glucose, Bld: 90 mg/dL (ref 65–99)
Potassium: 4.8 mmol/L (ref 3.5–5.3)
Sodium: 140 mmol/L (ref 135–146)
Total Bilirubin: 0.4 mg/dL (ref 0.2–1.2)
Total Protein: 6.1 g/dL (ref 6.1–8.1)

## 2019-12-01 LAB — POC URINALSYSI DIPSTICK (AUTOMATED)
Bilirubin, UA: NEGATIVE
Blood, UA: NEGATIVE
Glucose, UA: NEGATIVE
Ketones, UA: NEGATIVE
Leukocytes, UA: NEGATIVE
Nitrite, UA: NEGATIVE
Protein, UA: NEGATIVE
Spec Grav, UA: 1.02 (ref 1.010–1.025)
Urobilinogen, UA: 0.2 E.U./dL
pH, UA: 7 (ref 5.0–8.0)

## 2019-12-01 MED ORDER — ALBUTEROL SULFATE HFA 108 (90 BASE) MCG/ACT IN AERS
2.0000 | INHALATION_SPRAY | Freq: Four times a day (QID) | RESPIRATORY_TRACT | 2 refills | Status: DC | PRN
Start: 2019-12-01 — End: 2020-11-01

## 2019-12-01 MED ORDER — LEVOCETIRIZINE DIHYDROCHLORIDE 5 MG PO TABS: 5.0000 mg | ORAL_TABLET | Freq: Every evening | ORAL | 3 refills | Status: DC

## 2019-12-01 MED ORDER — FLUTICASONE PROPIONATE HFA 110 MCG/ACT IN AERO: 2.0000 | INHALATION_SPRAY | Freq: Two times a day (BID) | RESPIRATORY_TRACT | 12 refills | Status: DC

## 2019-12-01 MED ORDER — MUPIROCIN 2 % EX OINT: 1.0000 "application " | TOPICAL_OINTMENT | Freq: Two times a day (BID) | CUTANEOUS | 1 refills | Status: DC
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
## 2019-12-01 NOTE — Patient Instructions (Addendum)
You have recent history of UTI treated with Keflex.  They  told you protein on urinalysis at urgent care.  Will repeat UA today, get urine culture and get metabolic panel.  For history of MRSA and skin infection to digits bilaterally, I will prescribe mupirocin on topical antibiotic and advise to apply thin film twice daily.  You have small tiny scabs present in recommend covering with Band-Aids.  I do not think at this point you need oral antibiotics.  Recent CVA area pain might have indicated UTI.  However you report that they did not do a culture of your urine.  History of asthma and you do report using albuterol frequently so I want you to start Flovent 2 inhalations twice daily and will see if this decreases albuterol requirement.  For history of allergies, refilled your Xyzal antihistamine.  Follow-up in 3 weeks or as needed.

## 2019-12-02 LAB — URINE CULTURE
MICRO NUMBER:: 10999033
Result:: NO GROWTH
SPECIMEN QUALITY:: ADEQUATE

## 2019-12-04 ENCOUNTER — Ambulatory Visit: Payer: Medicare Other | Admitting: Neurology

## 2019-12-08 ENCOUNTER — Ambulatory Visit: Payer: Medicare Other | Admitting: Medical

## 2019-12-09 ENCOUNTER — Ambulatory Visit: Payer: Medicare Other | Admitting: Medical

## 2019-12-09 ENCOUNTER — Other Ambulatory Visit (HOSPITAL_COMMUNITY): Payer: Self-pay | Admitting: Orthopedic Surgery

## 2019-12-16 ENCOUNTER — Telehealth: Payer: Self-pay | Admitting: Gastroenterology

## 2019-12-16 NOTE — Telephone Encounter (Signed)
Hi Dr. Lyndel Safe, I just received a call from Vashti Hey, pt's [facility] mgr, to inform that pt is not feeling well and wishes to cancel her procedure that was scheduled with you tomorrow. She stated that pt will call back to reschedule. Thank you.

## 2019-12-17 ENCOUNTER — Encounter: Payer: Medicare Other | Admitting: Gastroenterology

## 2019-12-17 NOTE — Telephone Encounter (Signed)
Thanks for letting me know RG 

## 2020-01-06 ENCOUNTER — Encounter: Payer: Self-pay | Admitting: Neurology

## 2020-01-06 ENCOUNTER — Ambulatory Visit (INDEPENDENT_AMBULATORY_CARE_PROVIDER_SITE_OTHER): Payer: Medicare Other | Admitting: Neurology

## 2020-01-06 ENCOUNTER — Other Ambulatory Visit: Payer: Self-pay

## 2020-01-06 VITALS — BP 110/67 | HR 82 | Ht 64.0 in | Wt 130.8 lb

## 2020-01-06 DIAGNOSIS — G44309 Post-traumatic headache, unspecified, not intractable: Secondary | ICD-10-CM

## 2020-01-06 DIAGNOSIS — G5601 Carpal tunnel syndrome, right upper limb: Secondary | ICD-10-CM

## 2020-01-06 DIAGNOSIS — R569 Unspecified convulsions: Secondary | ICD-10-CM | POA: Diagnosis not present

## 2020-01-06 MED ORDER — CYCLOBENZAPRINE HCL 5 MG PO TABS: 5.0000 mg | ORAL_TABLET | Freq: Three times a day (TID) | ORAL | 11 refills | Status: DC | PRN

## 2020-01-06 MED ORDER — LEVETIRACETAM 500 MG PO TABS
ORAL_TABLET | ORAL | 11 refills | Status: DC
Start: 2020-01-06 — End: 2021-09-16
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
## 2020-01-07 ENCOUNTER — Other Ambulatory Visit: Payer: Self-pay

## 2020-01-07 ENCOUNTER — Encounter (HOSPITAL_BASED_OUTPATIENT_CLINIC_OR_DEPARTMENT_OTHER): Payer: Self-pay | Admitting: Orthopedic Surgery

## 2020-01-12 ENCOUNTER — Other Ambulatory Visit (HOSPITAL_COMMUNITY)
Admission: RE | Admit: 2020-01-12 | Discharge: 2020-01-12 | Disposition: A | Discharge status: Home / Self Care | Payer: Medicare Other | Source: Ambulatory Visit | Attending: Orthopedic Surgery | Admitting: Orthopedic Surgery

## 2020-01-12 DIAGNOSIS — Z01812 Encounter for preprocedural laboratory examination: Secondary | ICD-10-CM | POA: Diagnosis present

## 2020-01-12 DIAGNOSIS — Z20822 Contact with and (suspected) exposure to covid-19: Secondary | ICD-10-CM | POA: Diagnosis not present

## 2020-01-12 LAB — SARS CORONAVIRUS 2 (TAT 6-24 HRS): SARS Coronavirus 2: NEGATIVE

## 2020-01-14 NOTE — Progress Notes (Signed)

## 2020-01-15 ENCOUNTER — Ambulatory Visit (HOSPITAL_BASED_OUTPATIENT_CLINIC_OR_DEPARTMENT_OTHER): Payer: Medicare Other | Admitting: Anesthesiology

## 2020-01-15 ENCOUNTER — Ambulatory Visit (HOSPITAL_BASED_OUTPATIENT_CLINIC_OR_DEPARTMENT_OTHER)
Admission: RE | Admit: 2020-01-15 | Discharge: 2020-01-15 | Disposition: A | Discharge status: Home / Self Care | Payer: Medicare Other | Attending: Orthopedic Surgery | Admitting: Orthopedic Surgery

## 2020-01-15 ENCOUNTER — Encounter (HOSPITAL_BASED_OUTPATIENT_CLINIC_OR_DEPARTMENT_OTHER)
Admission: RE | Disposition: A | Discharge status: Home / Self Care | Payer: Self-pay | Source: Home / Self Care | Attending: Orthopedic Surgery

## 2020-01-15 ENCOUNTER — Encounter (HOSPITAL_BASED_OUTPATIENT_CLINIC_OR_DEPARTMENT_OTHER): Payer: Self-pay | Admitting: Orthopedic Surgery

## 2020-01-15 ENCOUNTER — Other Ambulatory Visit: Payer: Self-pay

## 2020-01-15 DIAGNOSIS — S93125A Dislocation of metatarsophalangeal joint of left lesser toe(s), initial encounter: Secondary | ICD-10-CM | POA: Insufficient documentation

## 2020-01-15 DIAGNOSIS — Z882 Allergy status to sulfonamides status: Secondary | ICD-10-CM | POA: Diagnosis not present

## 2020-01-15 DIAGNOSIS — S92332A Displaced fracture of third metatarsal bone, left foot, initial encounter for closed fracture: Secondary | ICD-10-CM | POA: Diagnosis not present

## 2020-01-15 DIAGNOSIS — X58XXXA Exposure to other specified factors, initial encounter: Secondary | ICD-10-CM | POA: Diagnosis not present

## 2020-01-15 DIAGNOSIS — F17210 Nicotine dependence, cigarettes, uncomplicated: Secondary | ICD-10-CM | POA: Insufficient documentation

## 2020-01-15 DIAGNOSIS — Z7951 Long term (current) use of inhaled steroids: Secondary | ICD-10-CM | POA: Insufficient documentation

## 2020-01-15 DIAGNOSIS — S92342A Displaced fracture of fourth metatarsal bone, left foot, initial encounter for closed fracture: Secondary | ICD-10-CM | POA: Diagnosis not present

## 2020-01-15 DIAGNOSIS — M21612 Bunion of left foot: Secondary | ICD-10-CM | POA: Diagnosis present

## 2020-01-15 DIAGNOSIS — Z79899 Other long term (current) drug therapy: Secondary | ICD-10-CM | POA: Insufficient documentation

## 2020-01-15 DIAGNOSIS — S92322A Displaced fracture of second metatarsal bone, left foot, initial encounter for closed fracture: Secondary | ICD-10-CM | POA: Insufficient documentation

## 2020-01-15 HISTORY — DX: Other complications of anesthesia, initial encounter: T88.59XA

## 2020-01-15 HISTORY — PX: BUNIONECTOMY: SHX129

## 2020-01-15 HISTORY — PX: ORIF TOE FRACTURE: SHX5032

## 2020-01-15 SURGERY — BUNIONECTOMY
Anesthesia: General | Site: Toe | Laterality: Left

## 2020-01-15 MED ORDER — ONDANSETRON HCL 4 MG/2ML IJ SOLN
INTRAMUSCULAR | Status: DC | PRN
  Administered 2020-01-15: 4 mg via INTRAVENOUS

## 2020-01-15 MED ORDER — CEFAZOLIN SODIUM-DEXTROSE 2-4 GM/100ML-% IV SOLN
2.0000 g | INTRAVENOUS | Status: AC
  Administered 2020-01-15: 2 g via INTRAVENOUS

## 2020-01-15 MED ORDER — DEXAMETHASONE SODIUM PHOSPHATE 10 MG/ML IJ SOLN
INTRAMUSCULAR | Status: AC
  Filled 2020-01-15: qty 1

## 2020-01-15 MED ORDER — LACTATED RINGERS IV SOLN: INTRAVENOUS | Status: DC

## 2020-01-15 MED ORDER — EPHEDRINE SULFATE-NACL 50-0.9 MG/10ML-% IV SOSY
PREFILLED_SYRINGE | INTRAVENOUS | Status: DC | PRN
  Administered 2020-01-15: 15 mg via INTRAVENOUS

## 2020-01-15 MED ORDER — LIDOCAINE 2% (20 MG/ML) 5 ML SYRINGE
INTRAMUSCULAR | Status: DC | PRN
  Administered 2020-01-15: 20 mg via INTRAVENOUS

## 2020-01-15 MED ORDER — MIDAZOLAM HCL 2 MG/2ML IJ SOLN
INTRAMUSCULAR | Status: AC
  Filled 2020-01-15: qty 2

## 2020-01-15 MED ORDER — EPHEDRINE 5 MG/ML INJ
INTRAVENOUS | Status: AC
  Filled 2020-01-15: qty 10

## 2020-01-15 MED ORDER — FENTANYL CITRATE (PF) 100 MCG/2ML IJ SOLN: 25.0000 ug | INTRAMUSCULAR | Status: DC | PRN

## 2020-01-15 MED ORDER — OXYCODONE HCL 5 MG PO TABS: 5.0000 mg | ORAL_TABLET | Freq: Four times a day (QID) | ORAL | 0 refills | Status: AC | PRN

## 2020-01-15 MED ORDER — ONDANSETRON HCL 4 MG/2ML IJ SOLN
INTRAMUSCULAR | Status: AC
  Filled 2020-01-15: qty 2

## 2020-01-15 MED ORDER — CEFAZOLIN SODIUM-DEXTROSE 2-4 GM/100ML-% IV SOLN
INTRAVENOUS | Status: AC
  Filled 2020-01-15: qty 100

## 2020-01-15 MED ORDER — LIDOCAINE 2% (20 MG/ML) 5 ML SYRINGE
INTRAMUSCULAR | Status: AC
  Filled 2020-01-15: qty 5

## 2020-01-15 MED ORDER — FENTANYL CITRATE (PF) 100 MCG/2ML IJ SOLN
100.0000 ug | Freq: Once | INTRAMUSCULAR | Status: AC
  Administered 2020-01-15: 100 ug via INTRAVENOUS

## 2020-01-15 MED ORDER — PHENYLEPHRINE HCL-NACL 10-0.9 MG/250ML-% IV SOLN
INTRAVENOUS | Status: DC | PRN
  Administered 2020-01-15: 50 ug/min via INTRAVENOUS

## 2020-01-15 MED ORDER — SODIUM CHLORIDE 0.9 % IV SOLN: INTRAVENOUS | Status: DC

## 2020-01-15 MED ORDER — CLONIDINE HCL (ANALGESIA) 100 MCG/ML EP SOLN
EPIDURAL | Status: DC | PRN
  Administered 2020-01-15: 50 ug

## 2020-01-15 MED ORDER — FENTANYL CITRATE (PF) 100 MCG/2ML IJ SOLN
INTRAMUSCULAR | Status: AC
  Filled 2020-01-15: qty 2

## 2020-01-15 MED ORDER — ACETAMINOPHEN 500 MG PO TABS
ORAL_TABLET | ORAL | Status: AC
  Filled 2020-01-15: qty 2

## 2020-01-15 MED ORDER — DEXAMETHASONE SODIUM PHOSPHATE 10 MG/ML IJ SOLN
INTRAMUSCULAR | Status: DC | PRN
  Administered 2020-01-15: 10 mg via INTRAVENOUS

## 2020-01-15 MED ORDER — MIDAZOLAM HCL 2 MG/2ML IJ SOLN
2.0000 mg | Freq: Once | INTRAMUSCULAR | Status: AC
  Administered 2020-01-15: 2 mg via INTRAVENOUS

## 2020-01-15 MED ORDER — AMISULPRIDE (ANTIEMETIC) 5 MG/2ML IV SOLN: 10.0000 mg | Freq: Once | INTRAVENOUS | Status: DC | PRN

## 2020-01-15 MED ORDER — BUPIVACAINE-EPINEPHRINE (PF) 0.5% -1:200000 IJ SOLN
INTRAMUSCULAR | Status: DC | PRN
  Administered 2020-01-15: 30 mL via PERINEURAL

## 2020-01-15 MED ORDER — MIDAZOLAM HCL 5 MG/5ML IJ SOLN
INTRAMUSCULAR | Status: DC | PRN
  Administered 2020-01-15: 2 mg via INTRAVENOUS

## 2020-01-15 MED ORDER — PROPOFOL 10 MG/ML IV BOLUS
INTRAVENOUS | Status: DC | PRN
  Administered 2020-01-15: 150 mg via INTRAVENOUS

## 2020-01-15 MED ORDER — PHENYLEPHRINE 40 MCG/ML (10ML) SYRINGE FOR IV PUSH (FOR BLOOD PRESSURE SUPPORT)
PREFILLED_SYRINGE | INTRAVENOUS | Status: DC | PRN
  Administered 2020-01-15 (×2): 80 ug via INTRAVENOUS

## 2020-01-15 MED ORDER — ACETAMINOPHEN 500 MG PO TABS
1000.0000 mg | ORAL_TABLET | Freq: Once | ORAL | Status: AC
  Administered 2020-01-15: 1000 mg via ORAL

## 2020-01-15 SURGICAL SUPPLY — 90 items
APL PRP STRL LF DISP 70% ISPRP (MISCELLANEOUS) ×1
BANDAGE ESMARK 6X9 LF (GAUZE/BANDAGES/DRESSINGS) IMPLANT
BIT DRILL 2.4 AO COUPLING CANN (BIT) ×2 IMPLANT
BLADE AVERAGE 25X9 (BLADE) ×2 IMPLANT
BLADE LONG MED 25X9 (BLADE) IMPLANT
BLADE MICRO SAGITTAL (BLADE) IMPLANT
BLADE MINI RND TIP GREEN BEAV (BLADE) IMPLANT
BLADE OSC/SAG .038X5.5 CUT EDG (BLADE) ×2 IMPLANT
BLADE SURG 15 STRL LF DISP TIS (BLADE) ×3 IMPLANT
BLADE SURG 15 STRL SS (BLADE) ×6
BNDG CMPR 9X4 STRL LF SNTH (GAUZE/BANDAGES/DRESSINGS)
BNDG CMPR 9X6 STRL LF SNTH (GAUZE/BANDAGES/DRESSINGS)
BNDG COHESIVE 4X5 TAN STRL (GAUZE/BANDAGES/DRESSINGS) ×2 IMPLANT
BNDG COHESIVE 6X5 TAN STRL LF (GAUZE/BANDAGES/DRESSINGS) IMPLANT
BNDG CONFORM 2 STRL LF (GAUZE/BANDAGES/DRESSINGS) ×2 IMPLANT
BNDG CONFORM 3 STRL LF (GAUZE/BANDAGES/DRESSINGS) ×2 IMPLANT
BNDG ELASTIC 4X5.8 VLCR STR LF (GAUZE/BANDAGES/DRESSINGS) IMPLANT
BNDG ESMARK 4X9 LF (GAUZE/BANDAGES/DRESSINGS) IMPLANT
BNDG ESMARK 6X9 LF (GAUZE/BANDAGES/DRESSINGS)
BOOT STEPPER DURA LG (SOFTGOODS) IMPLANT
BOOT STEPPER DURA MED (SOFTGOODS) IMPLANT
CANISTER SUCT 1200ML W/VALVE (MISCELLANEOUS) ×2 IMPLANT
CAP PIN ORTHO PINK (CAP) ×2 IMPLANT
CHLORAPREP W/TINT 26 (MISCELLANEOUS) ×2 IMPLANT
COVER BACK TABLE 60X90IN (DRAPES) ×2 IMPLANT
COVER WAND RF STERILE (DRAPES) IMPLANT
CUFF TOURN SGL QUICK 24 (TOURNIQUET CUFF)
CUFF TOURN SGL QUICK 34 (TOURNIQUET CUFF)
CUFF TRNQT CYL 24X4X16.5-23 (TOURNIQUET CUFF) IMPLANT
CUFF TRNQT CYL 34X4.125X (TOURNIQUET CUFF) IMPLANT
DECANTER SPIKE VIAL GLASS SM (MISCELLANEOUS) IMPLANT
DRAPE EXTREMITY T 121X128X90 (DISPOSABLE) ×2 IMPLANT
DRAPE OEC MINIVIEW 54X84 (DRAPES) ×2 IMPLANT
DRAPE U-SHAPE 47X51 STRL (DRAPES) ×2 IMPLANT
DRSG MEPITEL 4X7.2 (GAUZE/BANDAGES/DRESSINGS) ×2 IMPLANT
DRSG PAD ABDOMINAL 8X10 ST (GAUZE/BANDAGES/DRESSINGS) ×4 IMPLANT
ELECT REM PT RETURN 9FT ADLT (ELECTROSURGICAL) ×2
ELECTRODE REM PT RTRN 9FT ADLT (ELECTROSURGICAL) ×1 IMPLANT
GAUZE SPONGE 4X4 12PLY STRL (GAUZE/BANDAGES/DRESSINGS) ×2 IMPLANT
GLOVE BIO SURGEON STRL SZ8 (GLOVE) ×2 IMPLANT
GLOVE BIOGEL PI IND STRL 8 (GLOVE) ×2 IMPLANT
GLOVE BIOGEL PI INDICATOR 8 (GLOVE) ×2
GLOVE ECLIPSE 8.0 STRL XLNG CF (GLOVE) ×2 IMPLANT
GLOVE SURG SS PI 7.5 STRL IVOR (GLOVE) ×2 IMPLANT
GLOVE SURG SYN 7.5  E (GLOVE) ×1
GLOVE SURG SYN 7.5 E (GLOVE) ×1 IMPLANT
GOWN STRL REUS W/ TWL LRG LVL3 (GOWN DISPOSABLE) ×1 IMPLANT
GOWN STRL REUS W/ TWL XL LVL3 (GOWN DISPOSABLE) ×2 IMPLANT
GOWN STRL REUS W/TWL LRG LVL3 (GOWN DISPOSABLE) ×2
GOWN STRL REUS W/TWL XL LVL3 (GOWN DISPOSABLE) ×4
K-WIRE 9  SMOOTH .062 (WIRE) ×6 IMPLANT
K-WIRE DBL END .054 LG (WIRE) ×2 IMPLANT
K-WIRE TROC 1.25X150 (WIRE)
KWIRE TROC 1.25X150 (WIRE) IMPLANT
NEEDLE HYPO 22GX1.5 SAFETY (NEEDLE) IMPLANT
NEEDLE HYPO 25X1 1.5 SAFETY (NEEDLE) IMPLANT
NS IRRIG 1000ML POUR BTL (IV SOLUTION) ×2 IMPLANT
PACK BASIN DAY SURGERY FS (CUSTOM PROCEDURE TRAY) ×2 IMPLANT
PAD CAST 4YDX4 CTTN HI CHSV (CAST SUPPLIES) ×1 IMPLANT
PADDING CAST ABS 4INX4YD NS (CAST SUPPLIES)
PADDING CAST ABS COTTON 4X4 ST (CAST SUPPLIES) IMPLANT
PADDING CAST COTTON 4X4 STRL (CAST SUPPLIES) ×2
PADDING CAST COTTON 6X4 STRL (CAST SUPPLIES) IMPLANT
PENCIL SMOKE EVACUATOR (MISCELLANEOUS) ×2 IMPLANT
SANITIZER HAND PURELL 535ML FO (MISCELLANEOUS) ×2 IMPLANT
SCREW CANN PT 4.0X34 (Screw) ×2 IMPLANT
SCREW CORT 3.5X40 (Screw) ×2 IMPLANT
SHEET MEDIUM DRAPE 40X70 STRL (DRAPES) ×2 IMPLANT
SLEEVE SCD COMPRESS KNEE MED (MISCELLANEOUS) ×2 IMPLANT
SPLINT FAST PLASTER 5X30 (CAST SUPPLIES)
SPLINT PLASTER CAST FAST 5X30 (CAST SUPPLIES) IMPLANT
SPONGE LAP 18X18 RF (DISPOSABLE) ×2 IMPLANT
STOCKINETTE 6  STRL (DRAPES) ×1
STOCKINETTE 6 STRL (DRAPES) ×1 IMPLANT
SUCTION FRAZIER HANDLE 10FR (MISCELLANEOUS) ×1
SUCTION TUBE FRAZIER 10FR DISP (MISCELLANEOUS) ×1 IMPLANT
SUT ETHILON 3 0 PS 1 (SUTURE) ×2 IMPLANT
SUT FIBERWIRE #2 38 T-5 BLUE (SUTURE)
SUT MNCRL AB 3-0 PS2 18 (SUTURE) ×2 IMPLANT
SUT VIC AB 0 SH 27 (SUTURE) IMPLANT
SUT VIC AB 2-0 SH 27 (SUTURE)
SUT VIC AB 2-0 SH 27XBRD (SUTURE) IMPLANT
SUT VICRYL 0 UR6 27IN ABS (SUTURE) IMPLANT
SUTURE FIBERWR #2 38 T-5 BLUE (SUTURE) IMPLANT
SYR BULB EAR ULCER 3OZ GRN STR (SYRINGE) ×2 IMPLANT
SYR CONTROL 10ML LL (SYRINGE) IMPLANT
TOWEL GREEN STERILE FF (TOWEL DISPOSABLE) ×4 IMPLANT
TUBE CONNECTING 20X1/4 (TUBING) ×2 IMPLANT
UNDERPAD 30X36 HEAVY ABSORB (UNDERPADS AND DIAPERS) ×2 IMPLANT
YANKAUER SUCT BULB TIP NO VENT (SUCTIONS) IMPLANT

## 2020-01-15 NOTE — Discharge Instructions (Addendum)
Post Anesthesia Home Care Instructions  Activity: Get plenty of rest for the remainder of the day. A responsible individual must stay with you for 24 hours following the procedure.  For the next 24 hours, DO NOT: -Drive a car -Paediatric nurse -Drink alcoholic beverages -Take any medication unless instructed by your physician -Make any legal decisions or sign important papers.  Meals: Start with liquid foods such as gelatin or soup. Progress to regular foods as tolerated. Avoid greasy, spicy, heavy foods. If nausea and/or vomiting occur, drink only clear liquids until the nausea and/or vomiting subsides. Call your physician if vomiting continues.  Special Instructions/Symptoms: Your throat may feel dry or sore from the anesthesia or the breathing tube placed in your throat during surgery. If this causes discomfort, gargle with warm salt water. The discomfort should disappear within 24 hours.  If you had a scopolamine patch placed behind your ear for the management of post- operative nausea and/or vomiting:  1. The medication in the patch is effective for 72 hours, after which it should be removed.  Wrap patch in a tissue and discard in the trash. Wash hands thoroughly with soap and water. 2. You may remove the patch earlier than 72 hours if you experience unpleasant side effects which may include dry mouth, dizziness or visual disturbances. 3. Avoid touching the patch. Wash your hands with soap and water after contact with the patch.      Regional Anesthesia Blocks  1. Numbness or the inability to move the "blocked" extremity may last from 3-48 hours after placement. The length of time depends on the medication injected and your individual response to the medication. If the numbness is not going away after 48 hours, call your surgeon.  2. The extremity that is blocked will need to be protected until the numbness is gone and the  Strength has returned. Because you cannot feel it, you  will need to take extra care to avoid injury. Because it may be weak, you may have difficulty moving it or using it. You may not know what position it is in without looking at it while the block is in effect.  3. For blocks in the legs and feet, returning to weight bearing and walking needs to be done carefully. You will need to wait until the numbness is entirely gone and the strength has returned. You should be able to move your leg and foot normally before you try and bear weight or walk. You will need someone to be with you when you first try to ensure you do not fall and possibly risk injury.  4. Bruising and tenderness at the needle site are common side effects and will resolve in a few days.  5. Persistent numbness or new problems with movement should be communicated to the surgeon or the [place] [phone] [place] [phone]).   Wylene Simmer, MD [facility]  Please read the following information regarding your care after surgery.  Medications  You only need a prescription for the narcotic pain medicine (ex. oxycodone, Percocet, Norco).  All of the other medicines listed below are available over the counter. X Aleve 2 pills twice a day for the first 3 days after surgery. X acetominophen (Tylenol) 650 mg every 4-6 hours as you need for minor to moderate pain X oxycodone as prescribed for severe pain  Narcotic pain medicine (ex. oxycodone, Percocet, Vicodin) will cause constipation.  To prevent this problem, take the following medicines while you are  taking any pain medicine. X docusate sodium (Colace) 100 mg twice a day X senna (Senokot) 2 tablets twice a day  X To help prevent blood clots, take a baby aspirin (81 mg) twice a day for two weeks after surgery.  You should also get up every hour while you are awake to move around.    Weight Bearing ? Bear weight when you are able on your operated leg or foot. ? Bear weight only on your operated  foot in the post-op shoe. X Do not bear any weight on the operated leg or foot.  Cast / Splint / Dressing X Keep your splint, cast or dressing clean and dry.  Don't put anything (coat hanger, pencil, etc) down inside of it.  If it gets damp, use a hair dryer on the cool setting to dry it.  If it gets soaked, call the office to schedule an appointment for a cast change. ? Remove your dressing 3 days after surgery and cover the incisions with dry dressings.    After your dressing, cast or splint is removed; you may shower, but do not soak or scrub the wound.  Allow the water to run over it, and then gently pat it dry.  Swelling It is normal for you to have swelling where you had surgery.  To reduce swelling and pain, keep your toes above your nose for at least 3 days after surgery.  It may be necessary to keep your foot or leg elevated for several weeks.  If it hurts, it should be elevated.  Follow Up Call my office at [phone] when you are discharged from the hospital or surgery center to schedule an appointment to be seen two weeks after surgery.  Call my office at [phone] if you develop a fever >101.5 F, nausea, vomiting, bleeding from the surgical site or severe pain.

## 2020-01-15 NOTE — Anesthesia Procedure Notes (Signed)
Procedure Name: LMA Insertion Performed by: Cassie Henkels, Yoe, CRNA Pre-anesthesia Checklist: Patient identified, Emergency Drugs available, Suction available and Patient being monitored Patient Re-evaluated:Patient Re-evaluated prior to induction Oxygen Delivery Method: Circle system utilized Preoxygenation: Pre-oxygenation with 100% oxygen Induction Type: IV induction Ventilation: Mask ventilation without difficulty LMA: LMA inserted LMA Size: 3.0 Number of attempts: 1 Airway Equipment and Method: Bite block Placement Confirmation: positive ETCO2 Tube secured with: Tape Dental Injury: Teeth and Oropharynx as per pre-operative assessment        

## 2020-01-15 NOTE — Anesthesia Preprocedure Evaluation (Signed)
Anesthesia Evaluation  Patient identified by MRN, date of birth, ID band Patient awake    Reviewed: Allergy & Precautions, NPO status , Patient's Chart, lab work & pertinent test results  Airway Mallampati: II  TM Distance: >3 FB Neck ROM: Full    Dental  (+) Dental Advisory Given   Pulmonary asthma , Current Smoker,    breath sounds clear to auscultation       Cardiovascular negative cardio ROS   Rhythm:Regular Rate:Normal     Neuro/Psych  Headaches, Seizures -,     GI/Hepatic Neg liver ROS, hiatal hernia, GERD  ,  Endo/Other  Hypothyroidism   Renal/GU negative Renal ROS     Musculoskeletal   Abdominal   Peds  Hematology negative hematology ROS (+)   Anesthesia Other Findings   Reproductive/Obstetrics                             Anesthesia Physical Anesthesia Plan  ASA: II  Anesthesia Plan: General   Post-op Pain Management:  Regional for Post-op pain   Induction: Intravenous  PONV Risk Score and Plan: 2 and Dexamethasone, Ondansetron and Treatment may vary due to age or medical condition  Airway Management Planned: LMA  Additional Equipment:   Intra-op Plan:   Post-operative Plan: Extubation in OR  Informed Consent: I have reviewed the patients History and Physical, chart, labs and discussed the procedure including the risks, benefits and alternatives for the proposed anesthesia with the patient or authorized representative who has indicated his/her understanding and acceptance.     Dental advisory given  Plan Discussed with: CRNA  Anesthesia Plan Comments:         Anesthesia Quick Evaluation

## 2020-01-15 NOTE — Anesthesia Procedure Notes (Signed)
Anesthesia Regional Block: Popliteal block   Pre-Anesthetic Checklist: ,, timeout performed, Correct Patient, Correct Site, Correct Laterality, Correct Procedure, Correct Position, site marked, Risks and benefits discussed,  Surgical consent,  Pre-op evaluation,  At surgeon's request and post-op pain management  Laterality: Left  Prep: chloraprep       Needles:  Injection technique: Single-shot  Needle Type: Echogenic Needle     Needle Length: 9cm  Needle Gauge: 21     Additional Needles:   Procedures:,,,, ultrasound used (permanent image in chart),,,,  Narrative:  Start time: 01/15/2020 12:03 PM End time: 01/15/2020 12:08 PM Injection made incrementally with aspirations every 5 mL.  Performed by: Personally  Anesthesiologist: Suzette Battiest, MD

## 2020-01-15 NOTE — Anesthesia Postprocedure Evaluation (Signed)
Anesthesia Post Note  Patient: Sharon Bolton  Procedure(s) Performed: Left lapidus bunion correction (Left Toe) Open treatment of 2nd-4th metatarsal nonunions; open treatment of left 5th metatarsophalangeal dislocation (Left Toe)     Patient location during evaluation: PACU Anesthesia Type: General Level of consciousness: awake and alert Pain management: pain level controlled Vital Signs Assessment: post-procedure vital signs reviewed and stable Respiratory status: spontaneous breathing, nonlabored ventilation, respiratory function stable and patient connected to nasal cannula oxygen Cardiovascular status: blood pressure returned to baseline and stable Postop Assessment: no apparent nausea or vomiting Anesthetic complications: no   No complications documented.  Last Vitals:  Vitals:   01/15/20 1515 01/15/20 1530  BP: 102/62 (!) 96/55  Pulse: 86 (!) 59  Resp: 16 16  Temp:  36.6 C  SpO2: 100% 100%    Last Pain:  Vitals:   01/15/20 1530  TempSrc:   PainSc: 0-No pain                 Barnet Glasgow

## 2020-01-15 NOTE — Transfer of Care (Signed)
Immediate Anesthesia Transfer of Care Note  Patient: Ortencia L Raetz  Procedure(s) Performed: Left lapidus bunion correction (Left Toe) Open treatment of 2nd-4th metatarsal nonunions; open treatment of left 5th metatarsophalangeal dislocation (Left Toe)  Patient Location: PACU  Anesthesia Type:GA combined with regional for post-op pain  Level of Consciousness: sedated  Airway & Oxygen Therapy: Patient Spontanous Breathing and Patient connected to face mask oxygen  Post-op Assessment: Report given to RN and Post -op Vital signs reviewed and stable  Post vital signs: Reviewed and stable  Last Vitals:  Vitals Value Taken Time  BP 101/65 01/15/20 1453  Temp    Pulse 88 01/15/20 1455  Resp 14 01/15/20 1455  SpO2 100 % 01/15/20 1455  Vitals shown include unvalidated device data.  Last Pain:  Vitals:   01/15/20 1136  TempSrc: Oral  PainSc: 8       Patients Stated Pain Goal: 4 (72/90/21 1155)  Complications: No complications documented.

## 2020-01-15 NOTE — Op Note (Signed)
01/15/2020  2:54 PM  PATIENT:  Sharon Bolton  44 y.o. female  PRE-OPERATIVE DIAGNOSIS: 1.  Traumatic left foot bunion deformity 2.  Left second, third and fourth metatarsal fracture malunions 3.  Left fifth MTP joint chronic dislocation  POST-OPERATIVE DIAGNOSIS: Same  Procedure(s): 1.  Left lapidus bunion correction 2.  Open treatment of left second, third and fourth metatarsal malunions with internal fixation 3.  Open reduction of the left fifth MTP joint dislocation with internal fixation  SURGEON:  Wylene Simmer, MD  ASSISTANT: None  ANESTHESIA:   General, regional  EBL:  minimal   TOURNIQUET:   Total Tourniquet Time Documented: Thigh (Left) - 96 minutes Total: Thigh (Left) - 96 minutes  COMPLICATIONS:  None apparent  DISPOSITION:  Extubated, awake and stable to recovery.  INDICATION FOR PROCEDURE: The patient is a 44 year old female with a history of left forefoot trauma earlier this year.  She has atraumatic bunion deformity as well as malunions of her second, third and fourth metatarsals.  She also has a chronic dislocation of the fifth toe MTP joint.  She has failed nonoperative treatment to date and presents now for surgical correction of these painful left forefoot injuries.  The risks and benefits of the alternative treatment options have been discussed in detail.  The patient wishes to proceed with surgery and specifically understands risks of bleeding, infection, nerve damage, blood clots, need for additional surgery, amputation and death.   PROCEDURE IN DETAIL:  After pre operative consent was obtained, and the correct operative site was identified, the patient was brought to the operating room and placed supine on the OR table.  Anesthesia was administered.  Pre-operative antibiotics were administered.  A surgical timeout was taken.  The left lower extremity was prepped and draped in standard sterile fashion with a tourniquet around the thigh.  The extremity was  elevated and the tourniquet was inflated to 250 mmHg.  A longitudinal incision was made in the dorsum of the first webspace.  Dissection was carried down through the subcutaneous tissues.  The intermetatarsal ligament was divided under direct vision.  An arthrotomy was then made between the lateral sesamoid and the first metatarsal head.  Small perforations were made in the lateral joint capsule.  The hallux could then be positioned in 20 degrees of varus passively.  A longitudinal incision was made over the medial eminence.  Dissection was carried down through the subcutaneous tissues.  The medial joint capsule was elevated dorsally and plantarly after incising.  The medial eminence was identified.  It was resected in line with the first metatarsal shaft.  Attention was turned to the dorsum of the midfoot.  An incision was made and dissection carried down to the first TMT joint.  The interval between the EHL and EHB was developed.  The joint capsule was incised and elevated.  The oscillating saw was used to resect the remaining articular cartilage and subchondral bone from both sides of the first TMT joint.  More bone was taken laterally than medially to correct the intermetatarsal angle.  The joint was reduced and provisionally pinned.  Radiographs confirmed appropriate correction of the hallux valgus and intermetatarsal angles.  The joint was then fixed with a 4 mm partially-threaded cannulated screw from the [employer] 4 mm 5 mm cannulated screw set.  The screw was noted to have excellent purchase and compressed the arthrodesis site appropriately.  A 3.5 mm LPS screw was then placed from distal to proximal across the joint.  All 3 wounds were then irrigated.  The joint capsule was repaired with imbricating sutures of 2-0 Vicryl.  Subcutaneous tissues were approximated with Monocryl and the skin incisions were closed with nylon.  Attention was then turned to the second MTP joint.  An incision was made at  the second webspace.  Dissection was carried to the second MTP joint.  The extensor tendons were protected.  The fracture site was identified at the second metatarsal neck.  There was callus formation, but a partial fracture line is still evident.  The fracture was mobilized and the bone reduced.  A 0.0625 K wire was placed across the fracture site and out through the plantar surface of the foot.  The bone was reduced while the wire was advanced across into the proximal shaft of the second metatarsal.  Radiographs confirmed appropriate position of the wire.  It was then bent and trimmed.  The same procedure was then performed for the third and fourth metatarsal neck fracture malunions.  Both were pinned with K wires as described.  Attention was turned to the fifth MTP joint.  The extensor tendons were protected and the dorsal joint capsule incised.  The base of the proximal phalanx was cleaned of all fibrous tissue.  Soft tissues were elevated with a Research scientist (life sciences).  The joint could then be reduced.  A 0.0625 K wire was then inserted across the IP joints and the MP joint from the tip of the toe.  The K wire was advanced into the metatarsal shaft holding the reduction appropriately.  The K wire was then bent and trimmed.  Final AP, lateral and oblique radiographs of the left foot were obtained.  These show interval correction of the central metatarsal malunions as well as the chronic fifth MTP joint dislocation.  The bunion is noted to be appropriately corrected.  Hardware is appropriately positioned and of the appropriate lengths.  No other acute injuries are noted.  The wounds were irrigated copiously and closed with Monocryl and nylon.  Sterile dressings were applied followed by a well-padded short leg splint.  The tourniquet was released after application of the dressings.  The patient was awakened from anesthesia and transported to the recovery room in stable condition.  FOLLOW UP PLAN:  Nonweightbearing on the left lower extremity.  Follow-up in the office in 2 weeks for suture removal and conversion to a short leg cast.  Plan to pull the pins at 6 weeks postop.   RADIOGRAPHS:AP, lateral and oblique radiographs of the left foot were obtained.  These show interval correction of the central metatarsal malunions as well as the chronic fifth MTP joint dislocation.  The bunion is noted to be appropriately corrected.  Hardware is appropriately positioned and of the appropriate lengths.  No other acute injuries are noted.

## 2020-01-15 NOTE — Progress Notes (Signed)
Assisted Dr. Rob Fitzgerald with left, ultrasound guided, popliteal block. Side rails up, monitors on throughout procedure. See vital signs in flow sheet. Tolerated Procedure well. 
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
## 2020-01-16 ENCOUNTER — Encounter (HOSPITAL_BASED_OUTPATIENT_CLINIC_OR_DEPARTMENT_OTHER): Payer: Self-pay | Admitting: Orthopedic Surgery

## 2020-03-02 ENCOUNTER — Ambulatory Visit (INDEPENDENT_AMBULATORY_CARE_PROVIDER_SITE_OTHER): Payer: Medicare Other | Admitting: Medical

## 2020-03-02 ENCOUNTER — Other Ambulatory Visit: Payer: Self-pay

## 2020-03-02 ENCOUNTER — Telehealth: Payer: Self-pay | Admitting: Medical

## 2020-03-02 VITALS — BP 127/83 | HR 86 | Temp 98.4°F | Resp 17

## 2020-03-02 DIAGNOSIS — R110 Nausea: Secondary | ICD-10-CM

## 2020-03-02 DIAGNOSIS — L0882 Omphalitis not of newborn: Secondary | ICD-10-CM

## 2020-03-02 DIAGNOSIS — K219 Gastro-esophageal reflux disease without esophagitis: Secondary | ICD-10-CM | POA: Diagnosis not present

## 2020-03-02 DIAGNOSIS — R1031 Right lower quadrant pain: Secondary | ICD-10-CM

## 2020-03-02 LAB — LIPASE: Lipase: 19 U/L (ref 11.0–59.0)

## 2020-03-02 LAB — CBC WITH DIFFERENTIAL/PLATELET
Basophils Absolute: 0.1 10*3/uL (ref 0.0–0.1)
Basophils Relative: 0.7 % (ref 0.0–3.0)
Eosinophils Absolute: 0.3 10*3/uL (ref 0.0–0.7)
Eosinophils Relative: 3.8 % (ref 0.0–5.0)
HCT: 37.4 % (ref 36.0–46.0)
Hemoglobin: 12.3 g/dL (ref 12.0–15.0)
Lymphocytes Relative: 28.7 % (ref 12.0–46.0)
Lymphs Abs: 2.2 10*3/uL (ref 0.7–4.0)
MCHC: 33 g/dL (ref 30.0–36.0)
MCV: 92.7 fl (ref 78.0–100.0)
Monocytes Absolute: 0.5 10*3/uL (ref 0.1–1.0)
Monocytes Relative: 7 % (ref 3.0–12.0)
Neutro Abs: 4.5 10*3/uL (ref 1.4–7.7)
Neutrophils Relative %: 59.8 % (ref 43.0–77.0)
Platelets: 318 10*3/uL (ref 150.0–400.0)
RBC: 4.03 Mil/uL (ref 3.87–5.11)
RDW: 14.9 % (ref 11.5–15.5)
WBC: 7.5 10*3/uL (ref 4.0–10.5)

## 2020-03-02 LAB — COMPREHENSIVE METABOLIC PANEL
ALT: 12 U/L (ref 0–35)
AST: 13 U/L (ref 0–37)
Albumin: 4 g/dL (ref 3.5–5.2)
Alkaline Phosphatase: 64 U/L (ref 39–117)
BUN: 15 mg/dL (ref 6–23)
CO2: 31 mEq/L (ref 19–32)
Calcium: 9.2 mg/dL (ref 8.4–10.5)
Chloride: 104 mEq/L (ref 96–112)
Creatinine, Ser: 0.67 mg/dL (ref 0.40–1.20)
GFR: 106.11 mL/min (ref >60.00)
Glucose, Bld: 89 mg/dL (ref 70–99)
Potassium: 4.9 mEq/L (ref 3.5–5.1)
Sodium: 138 mEq/L (ref 135–145)
Total Bilirubin: 0.3 mg/dL (ref 0.2–1.2)
Total Protein: 6.7 g/dL (ref 6.0–8.3)

## 2020-03-02 MED ORDER — ONDANSETRON 4 MG PO TBDP: 4.0000 mg | ORAL_TABLET | Freq: Three times a day (TID) | ORAL | 0 refills | Status: DC | PRN

## 2020-03-02 MED ORDER — SUCRALFATE 1 G PO TABS: 1.0000 g | ORAL_TABLET | Freq: Three times a day (TID) | ORAL | 0 refills | Status: DC

## 2020-03-02 MED ORDER — CEPHALEXIN 500 MG PO CAPS: 500.0000 mg | ORAL_CAPSULE | Freq: Two times a day (BID) | ORAL | 0 refills | Status: DC

## 2020-03-02 NOTE — Patient Instructions (Signed)
Recent right lower quadrant/adnexal region pain.  Positive heel jar on exam and pain increase in rotation of right lower extremity.  Will get CT abdomen pelvis today.  Should get some information on appendix and some secondary incidental information on the right ovary.  Might later need to order ultrasound but will start with CT first.   Recent nausea with above.  I prescribe Zofran.  Recent omphalitis/skin infection with some scant honey crusting type discharge and mild tenderness.  Prescribed Keflex antibiotic.  Recent increase of reflux type symptoms.  Continue omeprazole, Pepcid, antacid and adding sucraflate.  Follow-up in 7 days or as needed.

## 2020-03-02 NOTE — Addendum Note (Signed)
Addended by: Gwenevere Abbot on: 03/02/2020 10:48 AM   Modules accepted: Orders
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
## 2020-03-02 NOTE — Telephone Encounter (Signed)
Pt need ct abd/pelvis due to rt lower quadrant pain. Can you get prior auth. Want test done later today or tomorrow.

## 2020-03-03 ENCOUNTER — Ambulatory Visit (HOSPITAL_BASED_OUTPATIENT_CLINIC_OR_DEPARTMENT_OTHER)
Admission: RE | Admit: 2020-03-03 | Discharge: 2020-03-03 | Disposition: A | Discharge status: Home / Self Care | Payer: Medicare Other | Source: Ambulatory Visit | Attending: Medical | Admitting: Medical

## 2020-03-03 ENCOUNTER — Encounter (HOSPITAL_BASED_OUTPATIENT_CLINIC_OR_DEPARTMENT_OTHER): Payer: Self-pay

## 2020-03-03 ENCOUNTER — Telehealth: Payer: Self-pay | Admitting: Medical

## 2020-03-03 DIAGNOSIS — R1031 Right lower quadrant pain: Secondary | ICD-10-CM | POA: Insufficient documentation

## 2020-03-03 IMAGING — CT CT ABD-PELV W/ CM
2 of 5 series · 16 of 46 positions shown, 18 images · IV contrast (Omnipaque)
Comparison: None.

CLINICAL DATA: RIGHT lower quadrant abdominal pain.

EXAM:
CT ABDOMEN AND PELVIS WITH CONTRAST
TECHNIQUE: Multidetector CT imaging of the abdomen and pelvis was performed
using the standard protocol following bolus administration of
intravenous contrast.
CONTRAST:  100mL OMNIPAQUE IOHEXOL 300 MG/ML  SOLN

[Series 2: axial st · axial · 0.82mm/px · z∈[-710,-334]mm · 13 of 87 slices shown, 15 images]
[im 6/87  soft-tissue]
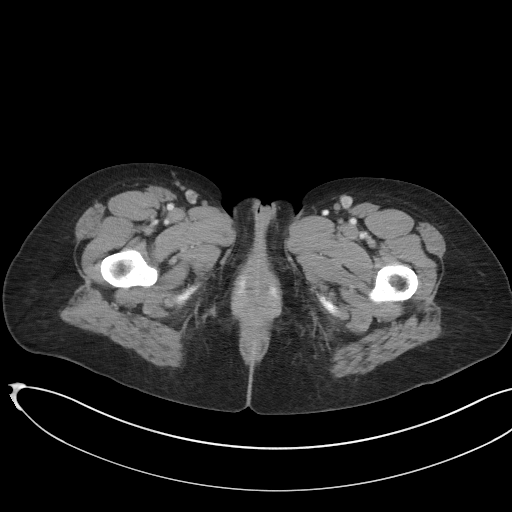
[im 6/87  bone]
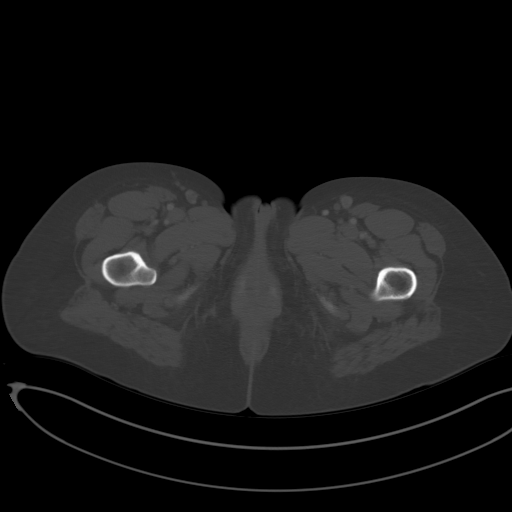
[im 11/87  soft-tissue]
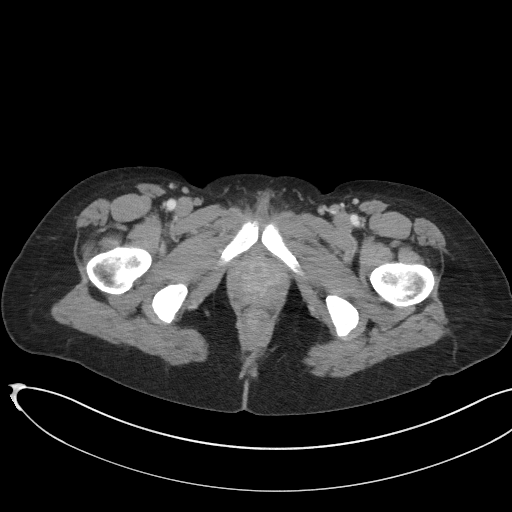
[im 21/87  soft-tissue]
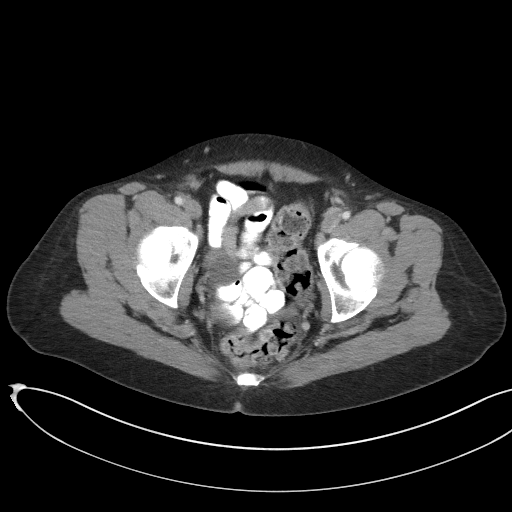
[im 26/87  soft-tissue]
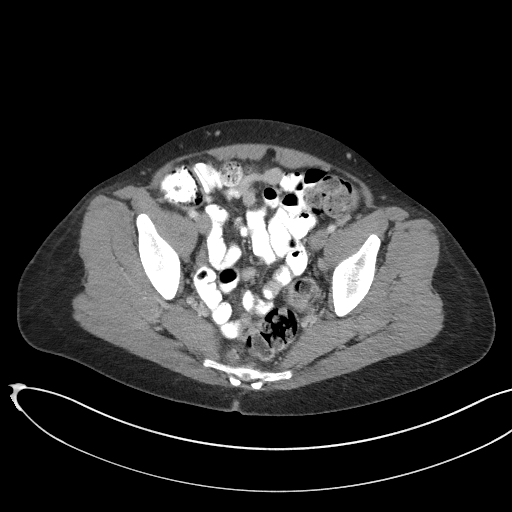
[im 31/87  soft-tissue]
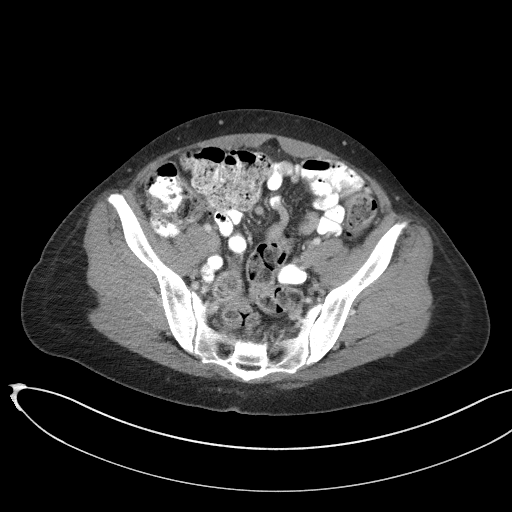
[im 36/87  soft-tissue]
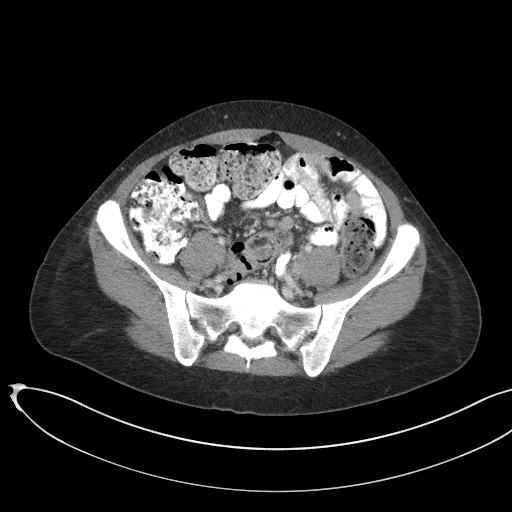
[im 46/87  soft-tissue]
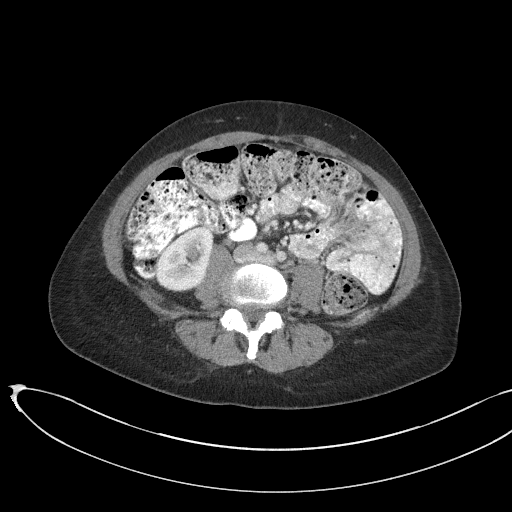
[im 51/87  soft-tissue]
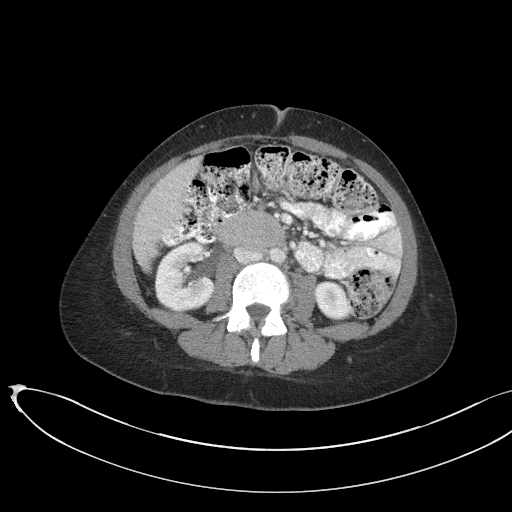
[im 56/87  soft-tissue]
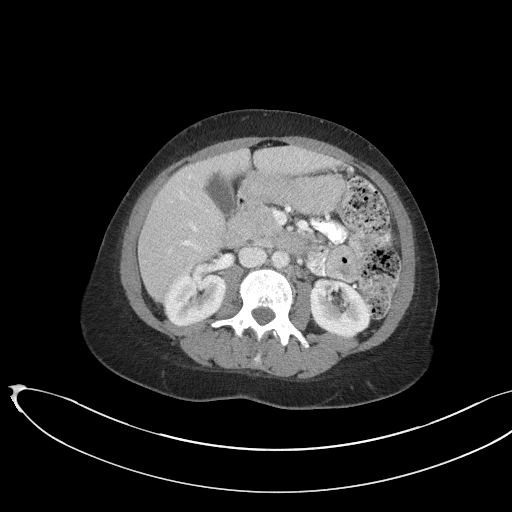
[im 56/87  bone]
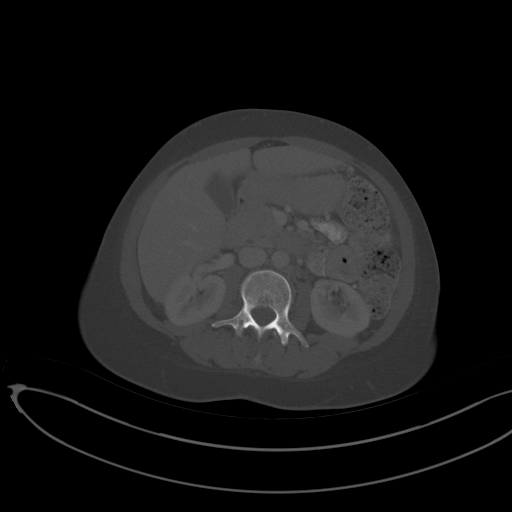
[im 61/87  soft-tissue]
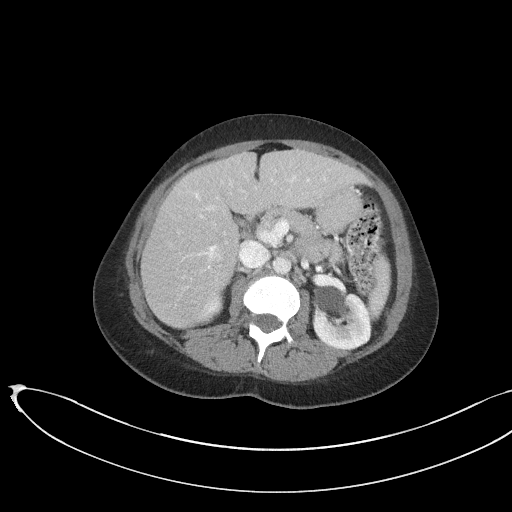
[im 66/87  soft-tissue]
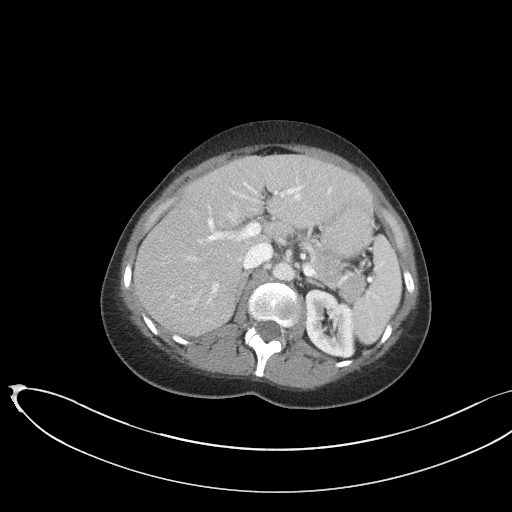
[im 76/87  soft-tissue]
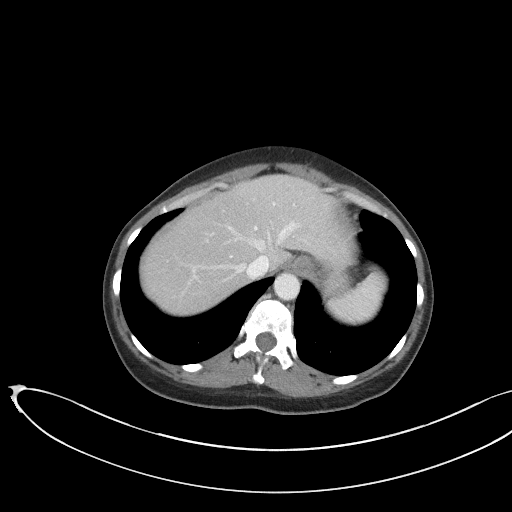
[im 81/87  soft-tissue]
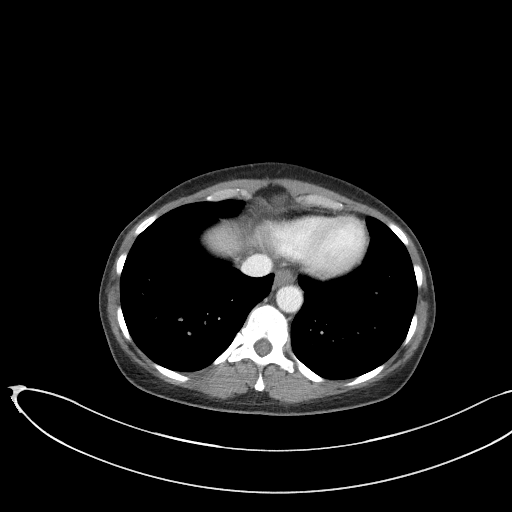

[Series 5: coronal st · coronal · 0.75mm/px · 3 of 86 slices shown]
[im 29/86  soft-tissue]
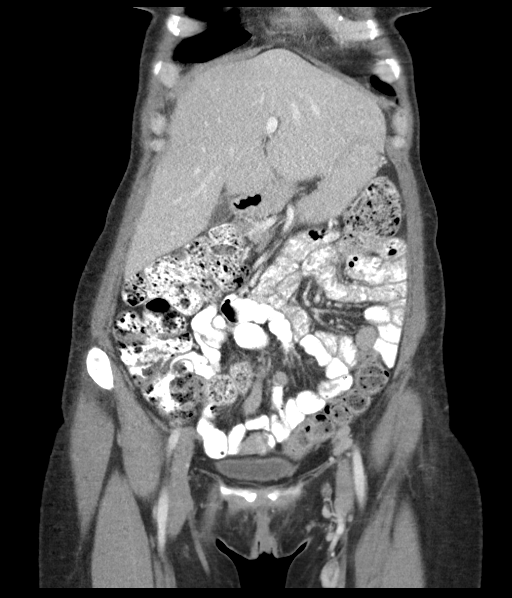
[im 38/86  soft-tissue]
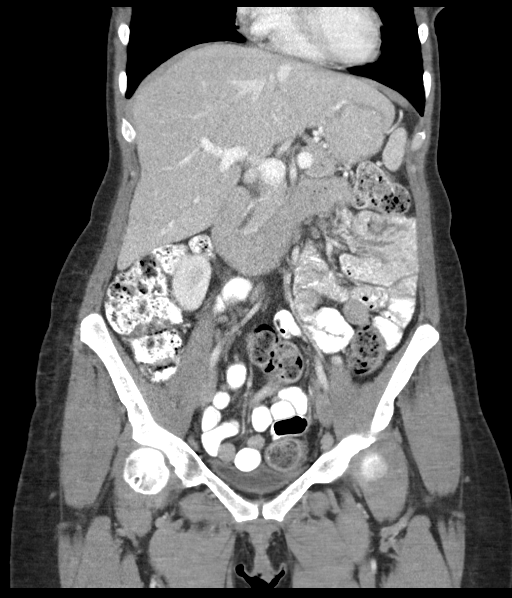
[im 48/86  soft-tissue]
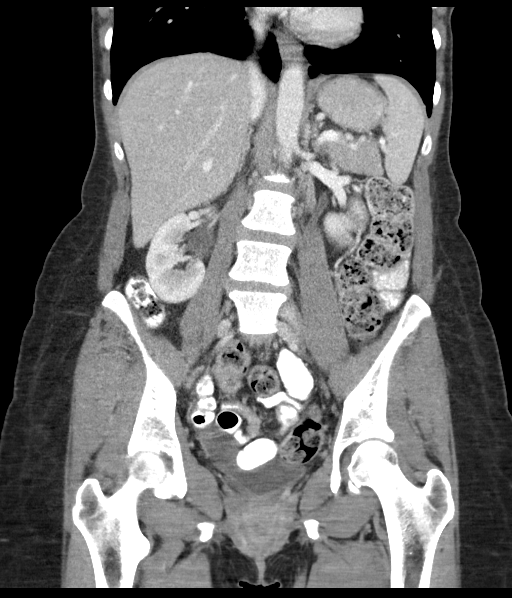

[16 of 46 positions shown; findings below may reference images not displayed]

FINDINGS: Lower chest: No acute abnormality.

Hepatobiliary: No focal liver abnormality is seen. No gallstones,
gallbladder wall thickening, or biliary dilatation.

Pancreas: Unremarkable. No pancreatic ductal dilatation or
surrounding inflammatory changes.

Spleen: Normal in size without focal abnormality.

Adrenals/Urinary Tract: Adrenal glands appear normal. Kidneys are
unremarkable without mass, stone or hydronephrosis. No perinephric
fluid. No ureteral or bladder calculi are identified. Bladder is
unremarkable, partially decompressed.

Stomach/Bowel: No dilated large or small bowel loops. Large amount
of stool throughout the colon. Questionable thickening of the walls
of the duodenum and proximal jejunum. Remainder of the small bowel
is unremarkable, without mesenteric stranding or other secondary
signs of bowel inflammation. Stomach is unremarkable, partially
decompressed. Appendix is normal.

Vascular/Lymphatic: No significant vascular findings are present. No
enlarged abdominal or pelvic lymph nodes.

Reproductive: Status post hysterectomy. No adnexal mass or free
fluid.

Other: No free fluid or abscess collection is seen. No free
intraperitoneal air.

Musculoskeletal: No acute appearing osseous abnormality. Superficial
soft tissues of the abdominal wall are unremarkable.
IMPRESSION: 1. Questionable thickening of the walls of the duodenum and proximal
jejunum, raising the possibility of a mild enteritis of infectious
or inflammatory nature.
2. Large amount of stool throughout the colon (constipation?).
3. Remainder of the abdomen and pelvis CT is unremarkable. No bowel
obstruction. No evidence of acute solid organ abnormality. No renal
or ureteral calculi. Appendix is normal.

## 2020-03-03 MED ORDER — METRONIDAZOLE 500 MG PO TABS: 500.0000 mg | ORAL_TABLET | Freq: Three times a day (TID) | ORAL | 0 refills | Status: DC

## 2020-03-03 MED ORDER — IOHEXOL 300 MG/ML  SOLN
100.0000 mL | Freq: Once | INTRAMUSCULAR | Status: AC | PRN
  Administered 2020-03-03: 100 mL via INTRAVENOUS

## 2020-03-03 MED ORDER — FLUCONAZOLE 150 MG PO TABS: 150.0000 mg | ORAL_TABLET | Freq: Once | ORAL | 0 refills | Status: AC

## 2020-03-03 MED ORDER — CIPROFLOXACIN HCL 500 MG PO TABS: 500.0000 mg | ORAL_TABLET | Freq: Two times a day (BID) | ORAL | 0 refills | Status: DC

## 2020-03-03 NOTE — Telephone Encounter (Signed)
rx cipro and flagyl sent in. Stop keflex.

## 2020-03-31 ENCOUNTER — Ambulatory Visit: Payer: Medicare Other | Admitting: Medical

## 2020-04-01 ENCOUNTER — Telehealth: Payer: Self-pay | Admitting: Medical

## 2020-04-01 ENCOUNTER — Ambulatory Visit (INDEPENDENT_AMBULATORY_CARE_PROVIDER_SITE_OTHER): Payer: Medicare Other | Admitting: Medical

## 2020-04-01 ENCOUNTER — Ambulatory Visit (HOSPITAL_BASED_OUTPATIENT_CLINIC_OR_DEPARTMENT_OTHER)
Admission: RE | Admit: 2020-04-01 | Discharge: 2020-04-01 | Disposition: A | Discharge status: Home / Self Care | Payer: Medicare Other | Source: Ambulatory Visit | Attending: Medical | Admitting: Medical

## 2020-04-01 ENCOUNTER — Other Ambulatory Visit: Payer: Self-pay

## 2020-04-01 VITALS — BP 103/72 | HR 99 | Temp 98.5°F | Resp 18 | Ht 64.0 in | Wt 139.4 lb

## 2020-04-01 DIAGNOSIS — R109 Unspecified abdominal pain: Secondary | ICD-10-CM | POA: Diagnosis not present

## 2020-04-01 DIAGNOSIS — K5900 Constipation, unspecified: Secondary | ICD-10-CM

## 2020-04-01 DIAGNOSIS — R102 Pelvic and perineal pain: Secondary | ICD-10-CM

## 2020-04-01 DIAGNOSIS — K219 Gastro-esophageal reflux disease without esophagitis: Secondary | ICD-10-CM | POA: Diagnosis not present

## 2020-04-01 LAB — COMPREHENSIVE METABOLIC PANEL
ALT: 14 U/L (ref 0–35)
AST: 16 U/L (ref 0–37)
Albumin: 4.2 g/dL (ref 3.5–5.2)
Alkaline Phosphatase: 53 U/L (ref 39–117)
BUN: 14 mg/dL (ref 6–23)
CO2: 33 mEq/L — ABNORMAL HIGH (ref 19–32)
Calcium: 9.7 mg/dL (ref 8.4–10.5)
Chloride: 103 mEq/L (ref 96–112)
Creatinine, Ser: 0.72 mg/dL (ref 0.40–1.20)
GFR: 101.44 mL/min (ref >60.00)
Glucose, Bld: 90 mg/dL (ref 70–99)
Potassium: 4.6 mEq/L (ref 3.5–5.1)
Sodium: 138 mEq/L (ref 135–145)
Total Bilirubin: 0.2 mg/dL (ref 0.2–1.2)
Total Protein: 6.9 g/dL (ref 6.0–8.3)

## 2020-04-01 LAB — CBC WITH DIFFERENTIAL/PLATELET
Basophils Absolute: 0 10*3/uL (ref 0.0–0.1)
Basophils Relative: 0.6 % (ref 0.0–3.0)
Eosinophils Absolute: 0.1 10*3/uL (ref 0.0–0.7)
Eosinophils Relative: 1.7 % (ref 0.0–5.0)
HCT: 39.5 % (ref 36.0–46.0)
Hemoglobin: 13.2 g/dL (ref 12.0–15.0)
Lymphocytes Relative: 22.9 % (ref 12.0–46.0)
Lymphs Abs: 1.9 10*3/uL (ref 0.7–4.0)
MCHC: 33.4 g/dL (ref 30.0–36.0)
MCV: 92.4 fl (ref 78.0–100.0)
Monocytes Absolute: 0.7 10*3/uL (ref 0.1–1.0)
Monocytes Relative: 8 % (ref 3.0–12.0)
Neutro Abs: 5.6 10*3/uL (ref 1.4–7.7)
Neutrophils Relative %: 66.8 % (ref 43.0–77.0)
Platelets: 288 10*3/uL (ref 150.0–400.0)
RBC: 4.27 Mil/uL (ref 3.87–5.11)
RDW: 14.5 % (ref 11.5–15.5)
WBC: 8.3 10*3/uL (ref 4.0–10.5)

## 2020-04-01 LAB — LIPASE: Lipase: 14 U/L (ref 11.0–59.0)

## 2020-04-01 IMAGING — DX DG ABDOMEN 1V
1 series · 1 of 1 positions shown · non-contrast
Comparison: CT abdomen and pelvis [DATE]

CLINICAL DATA: Constipation.  Abdominal pain.

EXAM:
ABDOMEN - 1 VIEW

[abdomen kub]
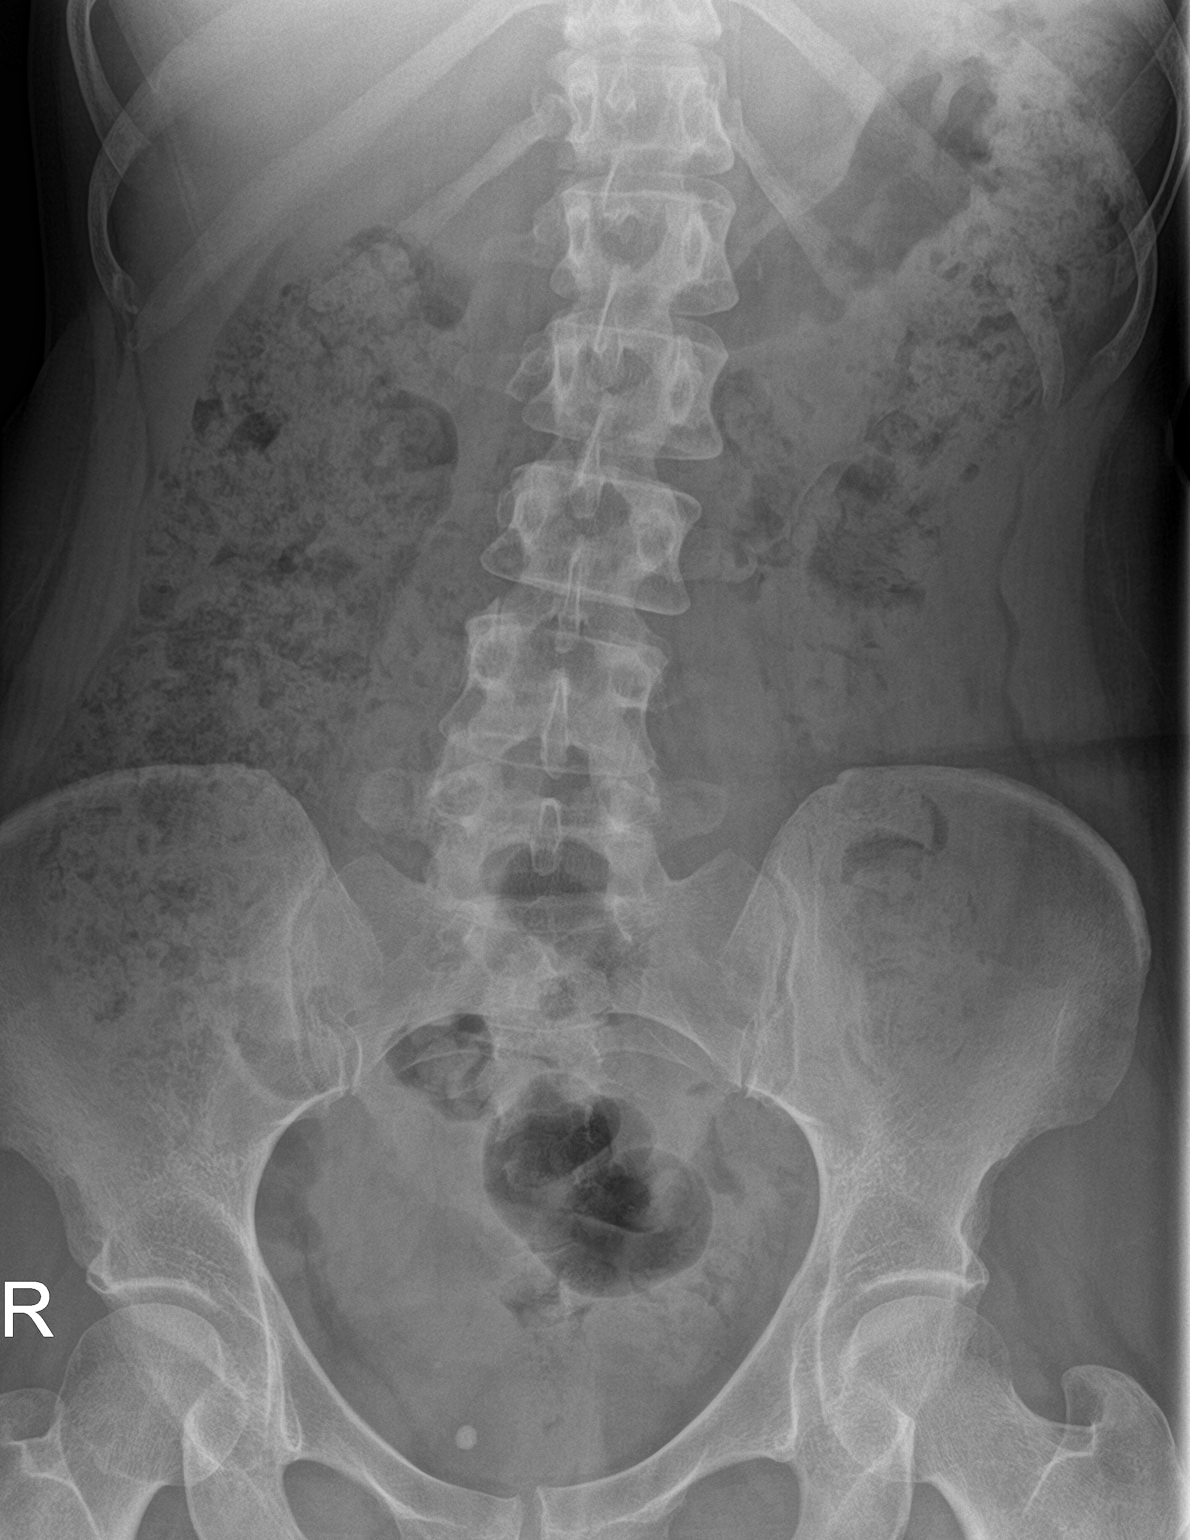

[1 of 1 positions shown; findings below may reference images not displayed]

FINDINGS: Large amount of stool in the abdomen particularly on the right side.
Mild curvature in the lumbar spine. There is a large calcification
in the right hemipelvis that likely represents a phlebolith. Limited
evaluation for renal stones due to large amount of stool.
Nonobstructive bowel gas pattern.
IMPRESSION: Large amount of stool in the abdomen and pelvis, particularly in the
right abdomen.

## 2020-04-01 MED ORDER — LEVOCETIRIZINE DIHYDROCHLORIDE 5 MG PO TABS: 5.0000 mg | ORAL_TABLET | Freq: Every evening | ORAL | 3 refills | Status: DC

## 2020-04-01 NOTE — Telephone Encounter (Signed)
Refill of xyzal sent to pt pharmacy.

## 2020-04-01 NOTE — Patient Instructions (Signed)
History of abdomen pain over the past month that has been intermittent.  Responds to Cipro and Flagyl early on in CT abdomen noted possible mild enteritis of infectious or inflammatory nature.  However there was large amount of stool throughout the colon noted.  Some intermittent constipation throughout the past month as well.  History of intermittent abdomen pain in the past and IBS constipation considered.  CT abdomen did not report any abnormal findings of appendix.   I will get CBC, CMP and lipase today.  Get 1 view abdomen x-ray to assess stool burden.  After x-ray review and consider possible Dulcolax, MiraLAX or magnesium citrate.  Will advise via MyChart later today.  Also consider trial IBS-see medication such as linzess.  Also referred to gyn to evaluate rt ovary.   Follow up in 7-10 days or as needed
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
## 2020-04-15 ENCOUNTER — Ambulatory Visit: Payer: Medicare Other | Admitting: Medical

## 2020-04-16 ENCOUNTER — Ambulatory Visit: Payer: Medicare Other | Admitting: Medical

## 2020-04-16 DIAGNOSIS — Z0289 Encounter for other administrative examinations: Secondary | ICD-10-CM

## 2020-04-20 ENCOUNTER — Telehealth: Payer: Self-pay | Admitting: Gastroenterology

## 2020-04-20 ENCOUNTER — Encounter: Payer: Self-pay | Admitting: Medical

## 2020-04-20 NOTE — Telephone Encounter (Signed)
Pt is requesting to reschedule her EGD Colonscopy from 12/2019. Would it be ok to reschedule her directly or would she need another consultation visit, she's 4 and is suffering again from acid reflux.

## 2020-04-22 ENCOUNTER — Telehealth: Payer: Self-pay

## 2020-04-22 NOTE — Telephone Encounter (Signed)
Caller states she wanted to schedule an appointment for Friday. Caller states she had a fall and she is having more pain near her ovary area and hip pain.  Telephone: [phone]

## 2020-04-23 NOTE — Telephone Encounter (Signed)
Appt scheduled 04/26/2020.

## 2020-04-23 NOTE — Telephone Encounter (Signed)
Unable to leave msg on pt's vm (mailbox not setup)

## 2020-04-23 NOTE — Telephone Encounter (Signed)
Endocolon r/s to 06/22/20 at 8:30am.

## 2020-04-26 ENCOUNTER — Ambulatory Visit: Payer: Medicare Other | Admitting: Family Medicine

## 2020-04-28 DIAGNOSIS — M25552 Pain in left hip: Secondary | ICD-10-CM | POA: Insufficient documentation

## 2020-04-28 DIAGNOSIS — M79641 Pain in right hand: Secondary | ICD-10-CM | POA: Insufficient documentation

## 2020-04-28 DIAGNOSIS — M79631 Pain in right forearm: Secondary | ICD-10-CM | POA: Insufficient documentation

## 2020-05-03 ENCOUNTER — Other Ambulatory Visit: Payer: Self-pay | Admitting: Sports Medicine

## 2020-05-03 ENCOUNTER — Other Ambulatory Visit (HOSPITAL_COMMUNITY): Payer: Self-pay | Admitting: Sports Medicine

## 2020-05-03 DIAGNOSIS — M25551 Pain in right hip: Secondary | ICD-10-CM

## 2020-05-06 ENCOUNTER — Other Ambulatory Visit (HOSPITAL_COMMUNITY)
Admission: RE | Admit: 2020-05-06 | Discharge: 2020-05-06 | Disposition: A | Discharge status: Home / Self Care | Payer: Medicare Other | Source: Ambulatory Visit | Attending: Medical | Admitting: Medical

## 2020-05-06 ENCOUNTER — Ambulatory Visit (INDEPENDENT_AMBULATORY_CARE_PROVIDER_SITE_OTHER): Payer: Medicare Other | Admitting: Medical

## 2020-05-06 ENCOUNTER — Other Ambulatory Visit: Payer: Self-pay

## 2020-05-06 VITALS — BP 113/79 | HR 100 | Ht 64.0 in | Wt 130.2 lb

## 2020-05-06 DIAGNOSIS — R300 Dysuria: Secondary | ICD-10-CM

## 2020-05-06 DIAGNOSIS — N898 Other specified noninflammatory disorders of vagina: Secondary | ICD-10-CM | POA: Insufficient documentation

## 2020-05-06 DIAGNOSIS — Z202 Contact with and (suspected) exposure to infections with a predominantly sexual mode of transmission: Secondary | ICD-10-CM

## 2020-05-06 DIAGNOSIS — B379 Candidiasis, unspecified: Secondary | ICD-10-CM

## 2020-05-06 DIAGNOSIS — R102 Pelvic and perineal pain: Secondary | ICD-10-CM

## 2020-05-06 DIAGNOSIS — M25559 Pain in unspecified hip: Secondary | ICD-10-CM

## 2020-05-06 DIAGNOSIS — Z113 Encounter for screening for infections with a predominantly sexual mode of transmission: Secondary | ICD-10-CM

## 2020-05-06 LAB — POC URINALSYSI DIPSTICK (AUTOMATED)
Bilirubin, UA: NEGATIVE
Blood, UA: NEGATIVE
Glucose, UA: NEGATIVE
Ketones, UA: NEGATIVE
Nitrite, UA: NEGATIVE
Protein, UA: POSITIVE — AB
Spec Grav, UA: 1.015 (ref 1.010–1.025)
Urobilinogen, UA: 0.2 E.U./dL
pH, UA: 7 (ref 5.0–8.0)

## 2020-05-06 MED ORDER — FLUCONAZOLE 150 MG PO TABS: ORAL_TABLET | ORAL | 1 refills | Status: DC

## 2020-05-06 MED ORDER — CIPROFLOXACIN HCL 500 MG PO TABS: 500.0000 mg | ORAL_TABLET | Freq: Two times a day (BID) | ORAL | 0 refills | Status: DC
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
## 2020-05-06 NOTE — Addendum Note (Signed)
Addended by: Kem Boroughs D on: 05/06/2020 11:26 AM   Modules accepted: Orders

## 2020-05-07 LAB — URINE CULTURE
MICRO NUMBER:: 11603246
SPECIMEN QUALITY:: ADEQUATE

## 2020-05-10 LAB — URINE CYTOLOGY ANCILLARY ONLY: Candida Urine: NEGATIVE

## 2020-05-15 ENCOUNTER — Other Ambulatory Visit: Payer: Self-pay

## 2020-05-15 ENCOUNTER — Ambulatory Visit (HOSPITAL_BASED_OUTPATIENT_CLINIC_OR_DEPARTMENT_OTHER)
Admission: RE | Admit: 2020-05-15 | Discharge: 2020-05-15 | Disposition: A | Discharge status: Home / Self Care | Payer: Medicare Other | Source: Ambulatory Visit | Attending: Sports Medicine | Admitting: Sports Medicine

## 2020-05-15 DIAGNOSIS — M25551 Pain in right hip: Secondary | ICD-10-CM | POA: Diagnosis present

## 2020-05-15 IMAGING — MR MR HIP*R* W/O CM
7 series · 40 of 40 positions shown · non-contrast
Comparison: None.

CLINICAL DATA: Right hip and groin pain. Limited range of motion.
Status post fall 1 month ago.

EXAM:
MR OF THE RIGHT HIP WITHOUT CONTRAST
TECHNIQUE: Multiplanar, multisequence MR imaging was performed. No intravenous
contrast was administered.

[Series 4: T1 · coronal · 4.0mm · 1.33mm/px · 7 of 34 slices shown]
[im 1/34]
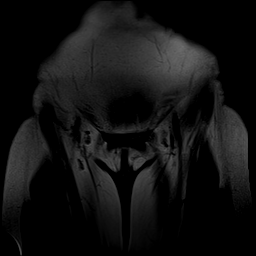
[im 6/34]
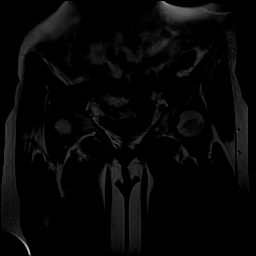
[im 12/34]
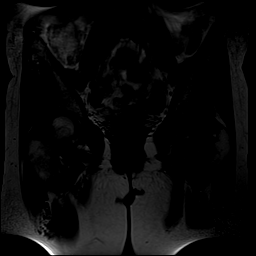
[im 17/34]
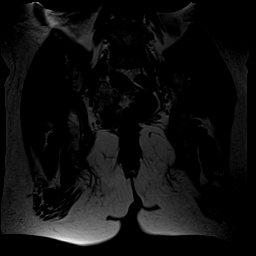
[im 23/34]
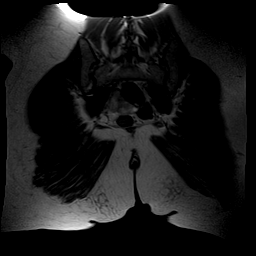
[im 28/34]
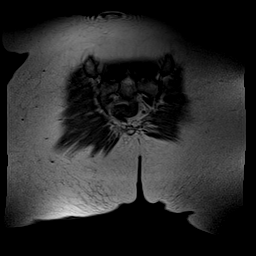
[im 34/34]
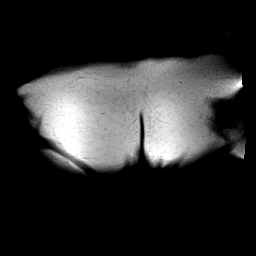

[Series 6: T2 fat-sat · axial · 4.0mm · 0.66mm/px · z∈[-123,+22]mm · 6 of 30 slices shown (1 of 2)]
[im 1/30]
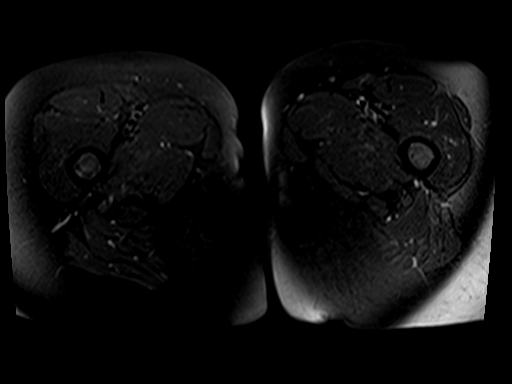
[im 6/30]
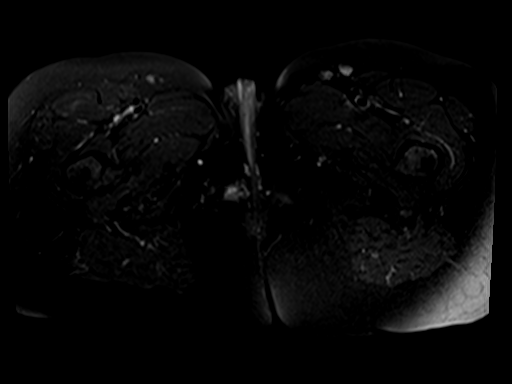
[im 12/30]
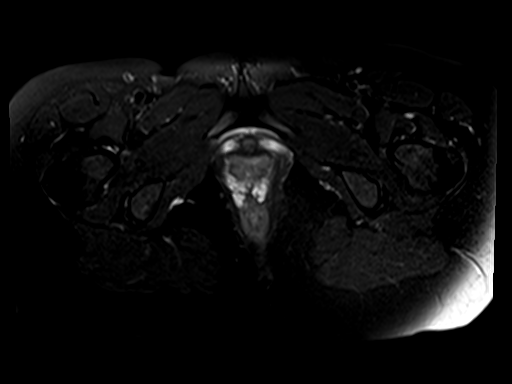
[im 18/30]
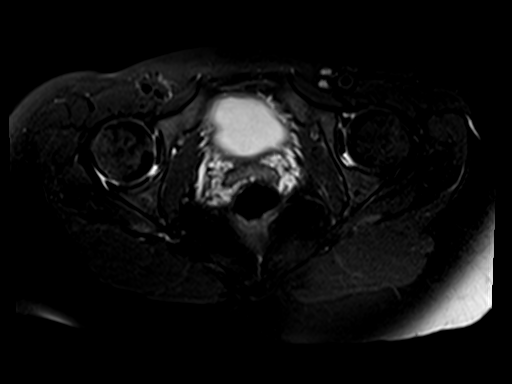
[im 24/30]
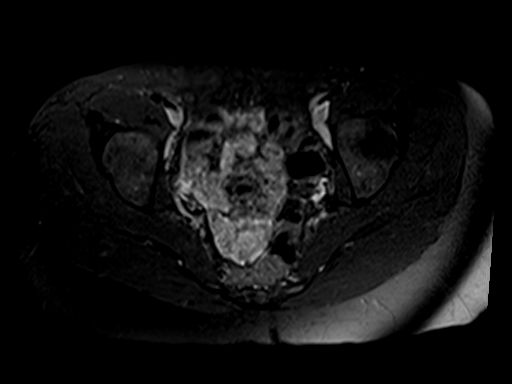
[im 30/30]
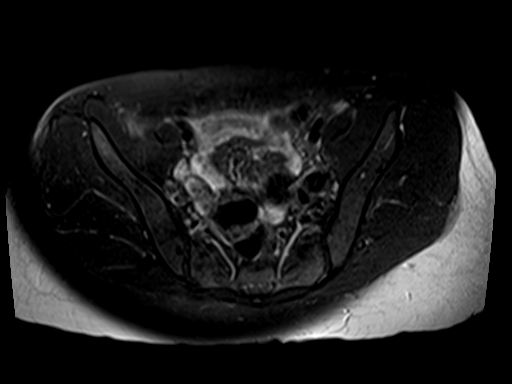

[Series 7: STIR · coronal · 4.0mm · 1.33mm/px · 7 of 34 slices shown]
[im 1/34]
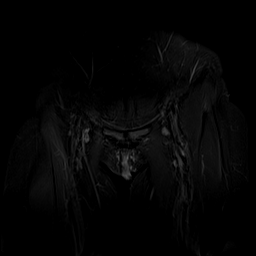
[im 6/34]
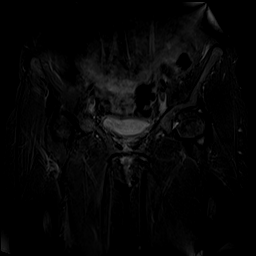
[im 12/34]
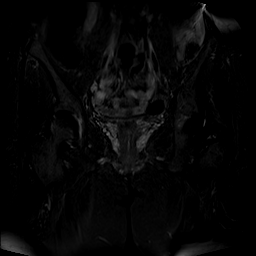
[im 17/34]
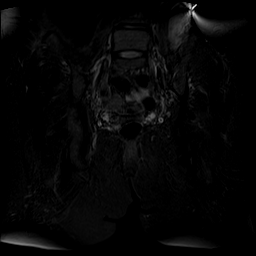
[im 23/34]
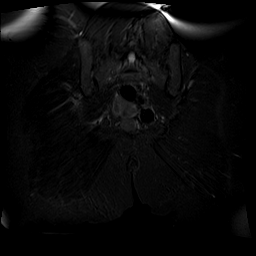
[im 28/34]
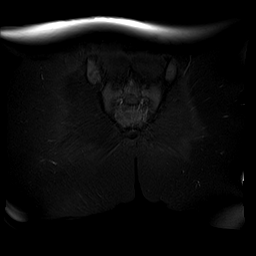
[im 34/34]
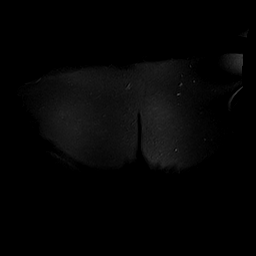

[Series 8: PD fat-sat · coronal · 4.0mm · 0.70mm/px · 5 of 25 slices shown (1 of 2)]
[im 1/25]
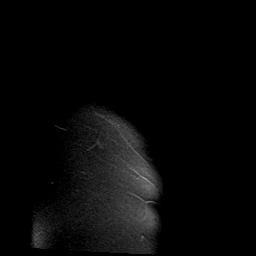
[im 7/25]
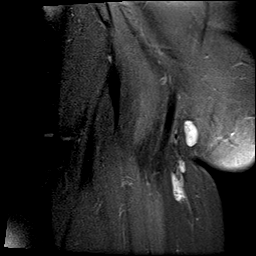
[im 13/25]
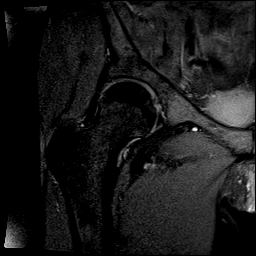
[im 19/25]
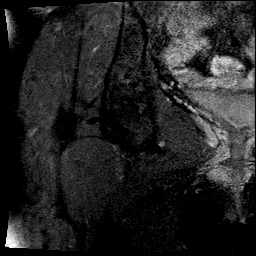
[im 25/25]
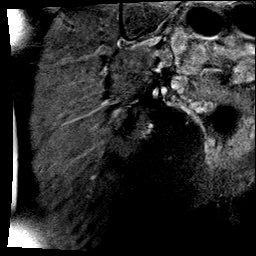

[Series 9: PD fat-sat · sagittal · 4.0mm · 0.70mm/px · 6 of 28 slices shown (2 of 2)]
[im 1/28]
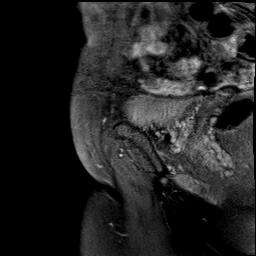
[im 6/28]
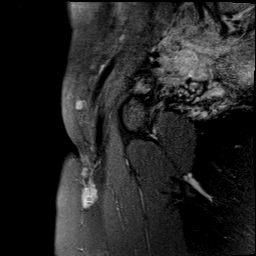
[im 11/28]
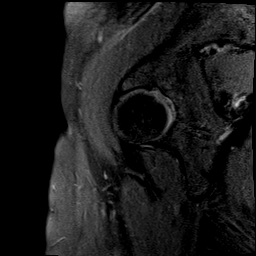
[im 17/28]
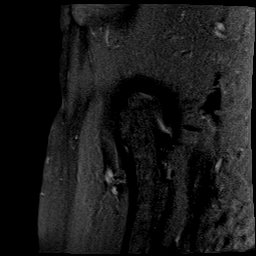
[im 22/28]
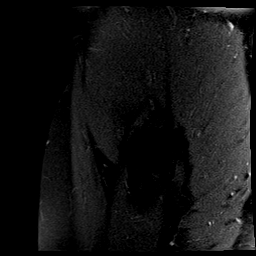
[im 28/28]
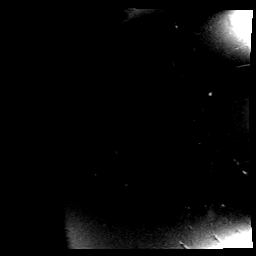

[Series 10: T2 fat-sat · coronal · 4.0mm · 1.33mm/px · 7 of 34 slices shown (2 of 2)]
[im 1/34]
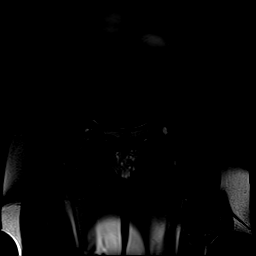
[im 6/34]
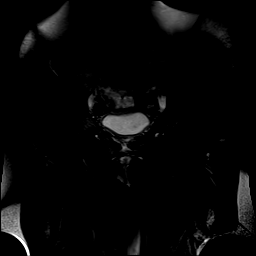
[im 12/34]
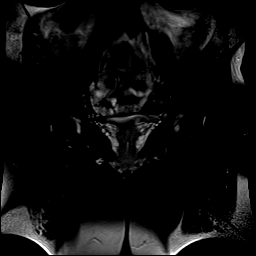
[im 17/34]
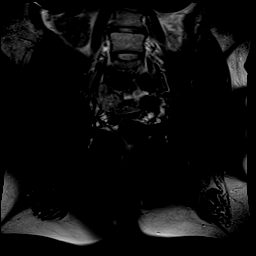
[im 23/34]
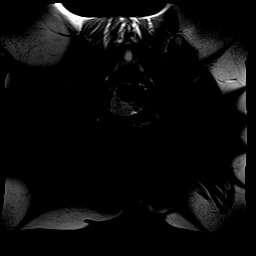
[im 28/34]
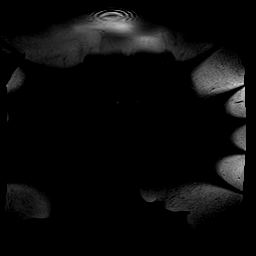
[im 34/34]
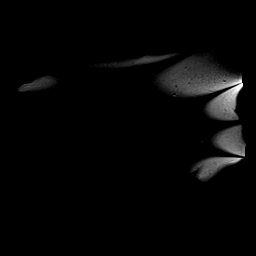

[Series 100: hx · coronal · 10.0mm · 0.86mm/px · 2 of 8 slices shown]
[im 1/8]
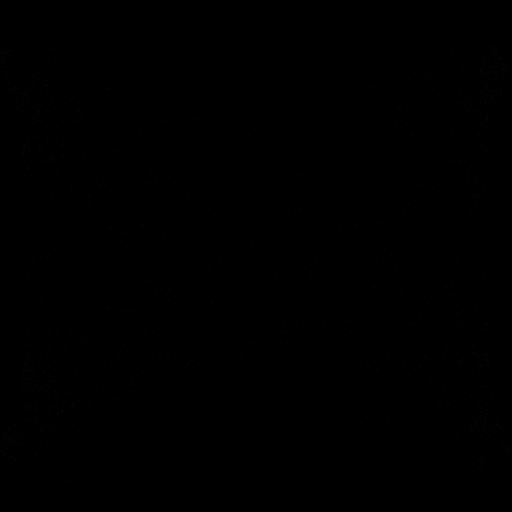
[im 8/8]
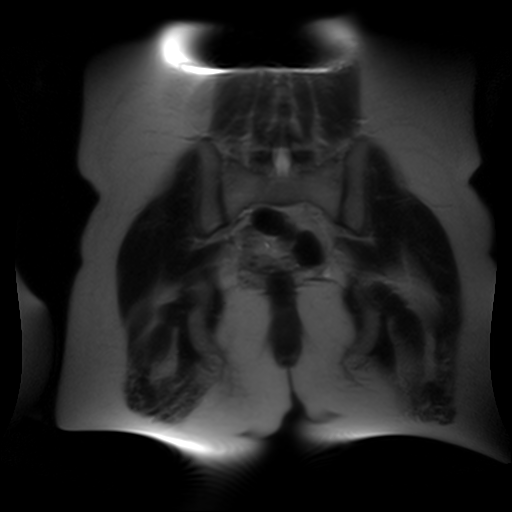

[40 of 40 positions shown; findings below may reference images not displayed]

FINDINGS: Patient motion degrades image quality limiting evaluation.

Bones:

No hip fracture, dislocation or avascular necrosis. No periosteal
reaction or bone destruction. No aggressive osseous lesion.

Normal sacrum and sacroiliac joints. No SI joint widening or erosive
changes.

Degenerative disease with disc height loss at L4-5.

Articular cartilage and labrum

Articular cartilage:  No chondral defect.

Labrum: Limited evaluation labrum secondary lack of intra-articular
fluid. Superior right labral degeneration with a probable tear of
the superior anterior labrum.

Joint or bursal effusion

Joint effusion:  No hip joint effusion.  No SI joint effusion.

Bursae: No bursa formation.

Muscles and tendons

Flexors: Normal.

Extensors: Normal.

Abductors: Normal.

Adductors: Normal.

Gluteals: Normal.

Hamstrings: Normal.

Other findings

No pelvic free fluid. No fluid collection or hematoma. No inguinal
lymphadenopathy. No inguinal hernia.
IMPRESSION: 1. No hip fracture, dislocation or avascular necrosis.
2. Limited evaluation labrum secondary lack of intra-articular
fluid. Superior right labral degeneration with a probable tear of
the superior anterior labrum.

## 2020-05-18 ENCOUNTER — Ambulatory Visit: Payer: Medicare Other | Admitting: Medical

## 2020-05-18 DIAGNOSIS — Z0289 Encounter for other administrative examinations: Secondary | ICD-10-CM

## 2020-06-09 ENCOUNTER — Other Ambulatory Visit: Payer: Self-pay

## 2020-06-09 ENCOUNTER — Ambulatory Visit (AMBULATORY_SURGERY_CENTER): Payer: Self-pay | Admitting: *Deleted

## 2020-06-09 VITALS — Ht 64.0 in | Wt 130.0 lb

## 2020-06-09 DIAGNOSIS — R1032 Left lower quadrant pain: Secondary | ICD-10-CM

## 2020-06-09 DIAGNOSIS — K581 Irritable bowel syndrome with constipation: Secondary | ICD-10-CM

## 2020-06-09 DIAGNOSIS — K219 Gastro-esophageal reflux disease without esophagitis: Secondary | ICD-10-CM

## 2020-06-09 NOTE — Progress Notes (Signed)
Pt states she has not had a seizure in more than 1 year  Pt has hx of hypotension after anesthesia, denies being told they were difficult to intubate, or hx/fam hx of malignant hyperthermia per pt   No egg or soy allergy  No home oxygen use   No medications for weight loss taken  emmi information given  2 day Miralax/Miralax prep given  Pt informed that we do not do prior authorizations for prep

## 2020-06-22 ENCOUNTER — Encounter: Payer: Medicare Other | Admitting: Gastroenterology

## 2020-07-14 ENCOUNTER — Ambulatory Visit: Payer: Medicare Other | Admitting: Medical

## 2020-07-16 ENCOUNTER — Other Ambulatory Visit: Payer: Self-pay

## 2020-07-16 ENCOUNTER — Ambulatory Visit (INDEPENDENT_AMBULATORY_CARE_PROVIDER_SITE_OTHER): Payer: Medicare Other | Admitting: Medical

## 2020-07-16 ENCOUNTER — Other Ambulatory Visit (HOSPITAL_COMMUNITY)
Admission: RE | Admit: 2020-07-16 | Discharge: 2020-07-16 | Disposition: A | Discharge status: Home / Self Care | Payer: Medicare Other | Source: Ambulatory Visit | Attending: Medical | Admitting: Medical

## 2020-07-16 VITALS — BP 97/69 | HR 94 | Resp 18 | Ht 64.0 in | Wt 128.0 lb

## 2020-07-16 DIAGNOSIS — F339 Major depressive disorder, recurrent, unspecified: Secondary | ICD-10-CM | POA: Diagnosis not present

## 2020-07-16 DIAGNOSIS — N63 Unspecified lump in unspecified breast: Secondary | ICD-10-CM

## 2020-07-16 DIAGNOSIS — K219 Gastro-esophageal reflux disease without esophagitis: Secondary | ICD-10-CM | POA: Diagnosis not present

## 2020-07-16 DIAGNOSIS — M25559 Pain in unspecified hip: Secondary | ICD-10-CM

## 2020-07-16 DIAGNOSIS — Z113 Encounter for screening for infections with a predominantly sexual mode of transmission: Secondary | ICD-10-CM

## 2020-07-16 DIAGNOSIS — R8290 Unspecified abnormal findings in urine: Secondary | ICD-10-CM

## 2020-07-16 DIAGNOSIS — Z202 Contact with and (suspected) exposure to infections with a predominantly sexual mode of transmission: Secondary | ICD-10-CM

## 2020-07-16 DIAGNOSIS — F419 Anxiety disorder, unspecified: Secondary | ICD-10-CM

## 2020-07-16 MED ORDER — METHYLPREDNISOLONE ACETATE 40 MG/ML IJ SUSP
40.0000 mg | Freq: Once | INTRAMUSCULAR | Status: AC
  Administered 2020-07-16: 40 mg via INTRAMUSCULAR

## 2020-07-16 NOTE — Patient Instructions (Signed)
History of right hip pain.  Labrum tear probable with degenerative changes on MRI.  Recommend that you call your orthopedist on Monday for follow-up appointment.  Today decided to give Depo-Medrol 40 mg IM injection.  Hopefully this will give you some pain relief.  Cannot tolerate NSAIDs due to stomach pain and hesitant to give narcotics based on other medications you are on presently.  For depression and anxiety continue with meds prescribed by psychiatrist.  Currently on Ativan, Cymbalta, gabapentin and Strattera.  Bilateral breast lumps recently with very strong family history of breast cancer.  Placed diagnostic mammogram order.  Recent abnormal urine odor as well as concern for STDs.  Will get urine ancillary studies, HIV screen and RPR.  Urine culture as well.  History of GERD and abdomen pain.  Follow through with colonoscopy and EGD scheduled later this month.  Follow-up in 3 to 4 weeks or as needed.
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
## 2020-07-17 ENCOUNTER — Encounter: Payer: Self-pay | Admitting: Medical

## 2020-07-19 ENCOUNTER — Telehealth: Payer: Self-pay | Admitting: Medical

## 2020-07-19 LAB — URINE CYTOLOGY ANCILLARY ONLY
Bacterial Vaginitis-Urine: NEGATIVE
Chlamydia: NEGATIVE
Comment: NEGATIVE
Comment: NEGATIVE
Comment: NORMAL
Neisseria Gonorrhea: NEGATIVE
Trichomonas: POSITIVE — AB

## 2020-07-19 LAB — RPR: RPR Ser Ql: NONREACTIVE

## 2020-07-19 LAB — URINE CULTURE
MICRO NUMBER:: 11888113
SPECIMEN QUALITY:: ADEQUATE

## 2020-07-19 LAB — HIV ANTIBODY (ROUTINE TESTING W REFLEX): HIV 1&2 Ab, 4th Generation: NONREACTIVE

## 2020-07-19 MED ORDER — METRONIDAZOLE 500 MG PO TABS: 500.0000 mg | ORAL_TABLET | Freq: Three times a day (TID) | ORAL | 0 refills | Status: AC

## 2020-07-19 MED ORDER — AMOXICILLIN-POT CLAVULANATE 875-125 MG PO TABS: 1.0000 | ORAL_TABLET | Freq: Two times a day (BID) | ORAL | 0 refills | Status: DC

## 2020-07-19 NOTE — Telephone Encounter (Signed)
Rx flagyl sent to pt pharmacy. 

## 2020-07-19 NOTE — Telephone Encounter (Signed)
Rx augmentin sent to pt pharmacy. 

## 2020-07-29 ENCOUNTER — Ambulatory Visit (AMBULATORY_SURGERY_CENTER): Payer: Medicare Other | Admitting: Gastroenterology

## 2020-07-29 ENCOUNTER — Encounter: Payer: Self-pay | Admitting: Gastroenterology

## 2020-07-29 ENCOUNTER — Other Ambulatory Visit: Payer: Self-pay

## 2020-07-29 VITALS — BP 133/107 | HR 83 | Temp 97.1°F | Resp 14 | Ht 64.0 in | Wt 130.0 lb

## 2020-07-29 DIAGNOSIS — R1032 Left lower quadrant pain: Secondary | ICD-10-CM

## 2020-07-29 DIAGNOSIS — K219 Gastro-esophageal reflux disease without esophagitis: Secondary | ICD-10-CM | POA: Diagnosis not present

## 2020-07-29 DIAGNOSIS — K2970 Gastritis, unspecified, without bleeding: Secondary | ICD-10-CM | POA: Diagnosis not present

## 2020-07-29 DIAGNOSIS — K581 Irritable bowel syndrome with constipation: Secondary | ICD-10-CM

## 2020-07-29 DIAGNOSIS — K648 Other hemorrhoids: Secondary | ICD-10-CM | POA: Diagnosis not present

## 2020-07-29 MED ORDER — SODIUM CHLORIDE 0.9 % IV SOLN: 500.0000 mL | Freq: Once | INTRAVENOUS | Status: DC

## 2020-07-29 NOTE — Op Note (Signed)
[facility] Patient Name: Sharon Bolton Procedure Date: 07/29/2020 3:14 PM MRN: [id number] Endoscopist: Jackquline Denmark , MD Age: 45 Referring MD:  Date of Birth: October 25, 1975 Gender: Female Account #: [id number] Procedure:                Colonoscopy Indications:              Abdominal pain in the left lower quadrant Medicines:                Monitored Anesthesia Care Procedure:                Pre-Anesthesia Assessment:                           - Prior to the procedure, a History and Physical                            was performed, and patient medications and                            allergies were reviewed. The patient's tolerance of                            previous anesthesia was also reviewed. The risks                            and benefits of the procedure and the sedation                            options and risks were discussed with the patient.                            All questions were answered, and informed consent                            was obtained. Prior Anticoagulants: The patient has                            taken no previous anticoagulant or antiplatelet                            agents. ASA Grade Assessment: II - A patient with                            mild systemic disease. After reviewing the risks                            and benefits, the patient was deemed in                            satisfactory condition to undergo the procedure.                           After obtaining informed consent, the colonoscope  was passed under direct vision. Throughout the                            procedure, the patient's blood pressure, pulse, and                            oxygen saturations were monitored continuously. The                            Olympus PFC-[id number] (#[id number]) Colonoscope was                            introduced through the anus and advanced to the 2                            cm into the ileum. The  quality of preparation got                            progressively worse especially with solid stool in                            the right side of the colon. This will clog the                            suction channel of the scope. Despite of aggressive                            suctioning and aspiration, the stool could not be                            cleared. The colonoscopy was performed without                            difficulty. The patient tolerated the procedure                            well. The quality of the bowel preparation was                            inadequate. Scope In: 3:28:36 PM Scope Out: 3:41:00 PM Scope Withdrawal Time: 0 hours 8 minutes 7 seconds  Total Procedure Duration: 0 hours 12 minutes 24 seconds  Findings:                 The colon (entire examined portion) appeared                            grossly normal without any circumferential lesions.                            Quality of preparation was poor                           The terminal ileum appeared normal.  Non-bleeding external and internal hemorrhoids were                            found during retroflexion. The hemorrhoids were                            small. Complications:            No immediate complications. Estimated Blood Loss:     Estimated blood loss: none. Impression:               - Preparation of the colon was inadequate.                           - Grossly normal colonoscopy. No obvious                            circumferential lesions.                           - No specimens collected. Recommendation:           - Patient has a contact number available for                            emergencies. The signs and symptoms of potential                            delayed complications were discussed with the                            patient. Return to normal activities tomorrow.                            Written discharge instructions were  provided to the                            patient.                           - Resume previous diet.                           - Continue present medications.                           - Repeat colonoscopy within 3 months with 2-day                            preparation for further evaluation.                           - Return to GI clinic PRN.                           - The findings and recommendations were discussed  with the designated responsible adult. Jackquline Denmark, MD 07/29/2020 3:52:43 PM This report has been signed electronically.

## 2020-07-29 NOTE — Progress Notes (Signed)
Called to room to assist during endoscopic procedure.  Patient ID and intended procedure confirmed with present staff. Received instructions for my participation in the procedure from the performing physician.  

## 2020-07-29 NOTE — Op Note (Signed)
[facility] Patient Name: Sharon Bolton Procedure Date: 07/29/2020 3:15 PM MRN: [id number] Endoscopist: Jackquline Denmark , MD Age: 45 Referring MD:  Date of Birth: 09/22/1975 Gender: Female Account #: [id number] Procedure:                Upper GI endoscopy Indications:              Suspected esophageal reflux Medicines:                Monitored Anesthesia Care Procedure:                Pre-Anesthesia Assessment:                           - Prior to the procedure, a History and Physical                            was performed, and patient medications and                            allergies were reviewed. The patient's tolerance of                            previous anesthesia was also reviewed. The risks                            and benefits of the procedure and the sedation                            options and risks were discussed with the patient.                            All questions were answered, and informed consent                            was obtained. Prior Anticoagulants: The patient has                            taken no previous anticoagulant or antiplatelet                            agents. ASA Grade Assessment: II - A patient with                            mild systemic disease. After reviewing the risks                            and benefits, the patient was deemed in                            satisfactory condition to undergo the procedure.                           After obtaining informed consent, the endoscope was  passed under direct vision. Throughout the                            procedure, the patient's blood pressure, pulse, and                            oxygen saturations were monitored continuously. The                            Endoscope was introduced through the mouth, and                            advanced to the second part of duodenum. The upper                            GI endoscopy was accomplished  without difficulty.                            The patient tolerated the procedure well. Scope In: Scope Out: Findings:                 The examined esophagus was normal with well-defined                            Z-line at 40 cm, examined by NBI. Biopsies were                            obtained from the proximal and distal esophagus                            with cold forceps for histology of suspected                            eosinophilic esophagitis.                           Localized mild inflammation characterized by                            erythema was found in the gastric antrum. Biopsies                            were taken with a cold forceps for histology. There                            was some residual whitish thick likely dissolved                            pill (?Carafate)                           The examined duodenum was normal. Biopsies for                            histology were taken with  a cold forceps for                            evaluation of celiac disease. Complications:            No immediate complications. Estimated Blood Loss:     Estimated blood loss: none. Impression:               - Mild gastritis. Recommendation:           - Patient has a contact number available for                            emergencies. The signs and symptoms of potential                            delayed complications were discussed with the                            patient. Return to normal activities tomorrow.                            Written discharge instructions were provided to the                            patient.                           - Resume previous diet.                           - Continue present medications.                           - Await pathology results.                           - Follow an antireflux regimen.                           - Avoid nonsteroidals.                           - The findings and recommendations were discussed                             with the designated responsible adult.                           - If still with problems, will perform further                            work-up by means of GES followed by pH/esophageal                            manometry. Jackquline Denmark, MD 07/29/2020 3:47:52 PM This report has been signed electronically.

## 2020-07-29 NOTE — Progress Notes (Signed)
1511 Robinul 0.1 mg IV given due large amount of secretions upon assessment.  MD made aware, vss  

## 2020-07-29 NOTE — Progress Notes (Signed)
Pt's states no medical or surgical changes since previsit or office visit.  VS taken by [facility]

## 2020-07-29 NOTE — Patient Instructions (Signed)
Handouts on hemorrhoids, gastritis, and gerd given to you today Await pathology results from Dr. Lyndel Safe Avoid ASPIRIN, ASPIRIN CONTAINING PRODUCTS (BC OR GOODY POWDERS) OR NSAIDS (IBUPROFEN, ADVIL, ALEVE, AND MOTRIN); Tylenol is okay to take  Repeat Colonoscopy in the next 3 months with 2 day prep Miralax recommended daily   YOU HAD AN ENDOSCOPIC PROCEDURE TODAY AT [facility]:   Refer to the procedure report that was given to you for any specific questions about what was found during the examination.  If the procedure report does not answer your questions, please call your gastroenterologist to clarify.  If you requested that your care partner not be given the details of your procedure findings, then the procedure report has been included in a sealed envelope for you to review at your convenience later.  YOU SHOULD EXPECT: Some feelings of bloating in the abdomen. Passage of more gas than usual.  Walking can help get rid of the air that was put into your GI tract during the procedure and reduce the bloating. If you had a lower endoscopy (such as a colonoscopy or flexible sigmoidoscopy) you may notice spotting of blood in your stool or on the toilet paper. If you underwent a bowel prep for your procedure, you may not have a normal bowel movement for a few days.  Please Note:  You might notice some irritation and congestion in your nose or some drainage.  This is from the oxygen used during your procedure.  There is no need for concern and it should clear up in a day or so.  SYMPTOMS TO REPORT IMMEDIATELY:   Following lower endoscopy (colonoscopy or flexible sigmoidoscopy):  Excessive amounts of blood in the stool  Significant tenderness or worsening of abdominal pains  Swelling of the abdomen that is new, acute  Fever of 100F or higher   Following upper endoscopy (EGD)  Vomiting of blood or coffee ground material  New chest pain or pain under the shoulder blades  Painful or  persistently difficult swallowing  New shortness of breath  Fever of 100F or higher  Black, tarry-looking stools  For urgent or emergent issues, a gastroenterologist can be reached at any hour by calling [phone]. Do not use MyChart messaging for urgent concerns.    DIET:  We do recommend a small meal at first, but then you may proceed to your regular diet.  Drink plenty of fluids but you should avoid alcoholic beverages for 24 hours.  ACTIVITY:  You should plan to take it easy for the rest of today and you should NOT DRIVE or use heavy machinery until tomorrow (because of the sedation medicines used during the test).    FOLLOW UP: Our staff will call the number listed on your records 48-72 hours following your procedure to check on you and address any questions or concerns that you may have regarding the information given to you following your procedure. If we do not reach you, we will leave a message.  We will attempt to reach you two times.  During this call, we will ask if you have developed any symptoms of COVID 19. If you develop any symptoms (ie: fever, flu-like symptoms, shortness of breath, cough etc.) before then, please call [phone].  If you test positive for Covid 19 in the 2 weeks post procedure, please call and report this information to [place].    If any biopsies were taken you will be contacted by phone or by letter within the next  1-3 weeks.  Please call us at [phone] if you have not heard about the biopsies in 3 weeks.    SIGNATURES/CONFIDENTIALITY: You and/or your care partner have signed paperwork which will be entered into your electronic medical record.  These signatures attest to the fact that that the information above on your After Visit Summary has been reviewed and is understood.  Full responsibility of the confidentiality of this discharge information lies with you and/or your care-partner.

## 2020-07-29 NOTE — Progress Notes (Signed)
Report given to PACU, vss 

## 2020-08-03 ENCOUNTER — Telehealth: Payer: Self-pay | Admitting: *Deleted

## 2020-08-03 ENCOUNTER — Telehealth: Payer: Self-pay

## 2020-08-03 NOTE — Telephone Encounter (Signed)
Called [phone] and the voice mail is not set up yet.  I was not able to leave a message that I called for follow up call.

## 2020-08-03 NOTE — Telephone Encounter (Signed)
  Follow up Call-  Call back number 07/29/2020  Post procedure Call Back phone  # [phone]  Permission to leave phone message Yes  Some recent data might be hidden     Patient questions:  Do you have a fever, pain , or abdominal swelling? No. Pain Score  0 *  Have you tolerated food without any problems? Yes.    Have you been able to return to your normal activities? Yes.    Do you have any questions about your discharge instructions: Diet   No. Medications  No. Follow up visit  No.  Do you have questions or concerns about your Care? No.  Actions: * If pain score is 4 or above: No action needed, pain <4.  1. Have you developed a fever since your procedure? no  2.   Have you had an respiratory symptoms (SOB or cough) since your procedure? no  3.   Have you tested positive for COVID 19 since your procedure no  4.   Have you had any family members/close contacts diagnosed with the COVID 19 since your procedure?  no   If yes to any of these questions please route to Joylene John, RN and Joella Prince, RN

## 2020-08-06 ENCOUNTER — Telehealth: Payer: Self-pay | Admitting: Gastroenterology

## 2020-08-06 DIAGNOSIS — K580 Irritable bowel syndrome with diarrhea: Secondary | ICD-10-CM

## 2020-08-06 DIAGNOSIS — R197 Diarrhea, unspecified: Secondary | ICD-10-CM

## 2020-08-06 NOTE — Telephone Encounter (Signed)
Patient reports loose stools and diarrhea.  She reports multiple times a day.  She is also has a "pimple like rash" . She just completed a round of Augmentin.  Please advise

## 2020-08-07 ENCOUNTER — Encounter: Payer: Self-pay | Admitting: Gastroenterology

## 2020-08-07 NOTE — Telephone Encounter (Signed)
Lets check stool for GI pathogen (including C Diff) RG  Sharon Bolton, Can you follow up results and can text me I am going to be away for 1 week RG

## 2020-08-09 NOTE — Telephone Encounter (Signed)
LVM for patient to call back  Patient will go to her PCP office to pick up her stool specimen kit and she should bring it back there as soon as possible

## 2020-08-09 NOTE — Telephone Encounter (Signed)
LVM again for patient

## 2020-08-12 NOTE — Telephone Encounter (Signed)
Patient returned the call and was advised of Dr. Steve Rattler recommendations.

## 2020-08-13 ENCOUNTER — Ambulatory Visit: Payer: Medicare Other | Admitting: Medical

## 2020-08-16 ENCOUNTER — Ambulatory Visit: Payer: Medicare Other | Admitting: Medical

## 2020-09-17 ENCOUNTER — Ambulatory Visit (INDEPENDENT_AMBULATORY_CARE_PROVIDER_SITE_OTHER): Payer: Medicare Other | Admitting: Medical

## 2020-09-17 ENCOUNTER — Other Ambulatory Visit (HOSPITAL_COMMUNITY)
Admission: RE | Admit: 2020-09-17 | Discharge: 2020-09-17 | Disposition: A | Discharge status: Home / Self Care | Payer: Medicare Other | Source: Ambulatory Visit | Attending: Medical | Admitting: Medical

## 2020-09-17 ENCOUNTER — Other Ambulatory Visit: Payer: Self-pay

## 2020-09-17 VITALS — BP 102/65 | HR 89 | Resp 18 | Ht 64.0 in | Wt 121.0 lb

## 2020-09-17 DIAGNOSIS — N649 Disorder of breast, unspecified: Secondary | ICD-10-CM

## 2020-09-17 DIAGNOSIS — R300 Dysuria: Secondary | ICD-10-CM

## 2020-09-17 DIAGNOSIS — H9201 Otalgia, right ear: Secondary | ICD-10-CM

## 2020-09-17 DIAGNOSIS — Z113 Encounter for screening for infections with a predominantly sexual mode of transmission: Secondary | ICD-10-CM | POA: Insufficient documentation

## 2020-09-17 DIAGNOSIS — N63 Unspecified lump in unspecified breast: Secondary | ICD-10-CM

## 2020-09-17 DIAGNOSIS — K219 Gastro-esophageal reflux disease without esophagitis: Secondary | ICD-10-CM

## 2020-09-17 LAB — POC URINALSYSI DIPSTICK (AUTOMATED)
Bilirubin, UA: NEGATIVE
Blood, UA: NEGATIVE
Glucose, UA: NEGATIVE
Ketones, UA: NEGATIVE
Leukocytes, UA: NEGATIVE
Nitrite, UA: NEGATIVE
Protein, UA: POSITIVE — AB
Spec Grav, UA: 1.02 (ref 1.010–1.025)
Urobilinogen, UA: 0.2 E.U./dL
pH, UA: 6 (ref 5.0–8.0)

## 2020-09-17 MED ORDER — FLUCONAZOLE 150 MG PO TABS: ORAL_TABLET | ORAL | 0 refills | Status: DC

## 2020-09-17 MED ORDER — NEOMYCIN-POLYMYXIN-HC 3.5-10000-1 OT SOLN
3.0000 [drp] | Freq: Four times a day (QID) | OTIC | 0 refills | Status: DC
Start: 2020-09-17 — End: 2021-05-27

## 2020-09-17 MED ORDER — AMOXICILLIN-POT CLAVULANATE 875-125 MG PO TABS: 1.0000 | ORAL_TABLET | Freq: Two times a day (BID) | ORAL | 0 refills | Status: DC

## 2020-09-17 MED ORDER — OMEPRAZOLE 40 MG PO CPDR: 40.0000 mg | DELAYED_RELEASE_CAPSULE | Freq: Every day | ORAL | 2 refills | Status: DC
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
## 2020-09-17 NOTE — Patient Instructions (Signed)
Recent moderate to severe right ear pain following traumatic head to right ear.  Presently moderate amount of wax present in canal prohibiting view of tympanic membrane.  The canal does look swollen.  Based on presentation considering possibility of otitis externa, otitis media and may be that she had traumatic right eardrum rupture 2 weeks ago.  Will prescribe Cortisporin otic drops and Augmentin oral antibiotic.  In about 5 days if your ear pain is resolved you could add on Debrox eardrops to soften wax.  Recent dysuria and question whether or not you have a UTI.  Your urinalysis does not look suspicious presently.  Decided to go ahead and do a urine culture.  Note Augmentin does have some coverage for urinary tract infections.  Also you request added STD ancillary studies.  You declined HIV and RPR lab work today.  History of GERD.  Refill your omeprazole.  Follow-up in 10 to 14 days or as needed.

## 2020-09-19 LAB — URINE CULTURE
MICRO NUMBER:: 12124735
SPECIMEN QUALITY:: ADEQUATE

## 2020-09-20 LAB — URINE CYTOLOGY ANCILLARY ONLY
Chlamydia: NEGATIVE
Comment: NEGATIVE
Comment: NEGATIVE
Comment: NORMAL
Neisseria Gonorrhea: NEGATIVE
Trichomonas: POSITIVE — AB

## 2020-09-21 ENCOUNTER — Telehealth: Payer: Self-pay | Admitting: Medical

## 2020-09-21 MED ORDER — METRONIDAZOLE 500 MG PO TABS: 500.0000 mg | ORAL_TABLET | Freq: Three times a day (TID) | ORAL | 0 refills | Status: DC

## 2020-09-21 NOTE — Telephone Encounter (Signed)
Rx flagyl sent to pt pharmacy. 

## 2020-09-23 ENCOUNTER — Ambulatory Visit: Payer: Medicare Other | Admitting: Neurology

## 2020-09-27 ENCOUNTER — Telehealth: Payer: Self-pay | Admitting: *Deleted

## 2020-09-27 ENCOUNTER — Other Ambulatory Visit: Payer: Self-pay

## 2020-09-27 ENCOUNTER — Ambulatory Visit (AMBULATORY_SURGERY_CENTER): Payer: Medicare Other | Admitting: *Deleted

## 2020-09-27 VITALS — Ht 64.0 in | Wt 120.0 lb

## 2020-09-27 DIAGNOSIS — K580 Irritable bowel syndrome with diarrhea: Secondary | ICD-10-CM

## 2020-09-27 DIAGNOSIS — R1032 Left lower quadrant pain: Secondary | ICD-10-CM

## 2020-09-27 MED ORDER — CLENPIQ 10-3.5-12 MG-GM -GM/160ML PO SOLN: 1.0000 | ORAL | 0 refills | Status: DC

## 2020-09-27 MED ORDER — ONDANSETRON HCL 4 MG PO TABS: 4.0000 mg | ORAL_TABLET | ORAL | 0 refills | Status: DC

## 2020-09-27 NOTE — Telephone Encounter (Signed)
Patient no show PV, called patient. No answer, "mail box full" unable to leave a message. I will try again later.

## 2020-09-27 NOTE — Telephone Encounter (Signed)
Called patient. Did pv over the phone with the patient.

## 2020-09-27 NOTE — Progress Notes (Signed)
Patient's pre-visit was done today over the phone with the patient due to COVID-19 pandemic. Name,DOB and address verified. Insurance verified. Patient denies any allergies to Eggs and Soy. Patient denies any problems with anesthesia/sedation. Patient denies taking diet pills or blood thinners. No home Oxygen. Patient will pick [place] a copy of prep instructions at the  [place] office and a copy sent to pt's Mychart-pt is aware. Pt may need a clenpiq sample-rx sent to pharmacy but if expensive she will go by office [facility] to get sample-pt is aware. Patient understands to call us back with any questions or concerns. Patient is aware of our care-partner policy and 0000000 safety protocol. Pt denies chart medical hx changes since last GI visit.  The patient is COVID-19 vaccinated, per patient.

## 2020-09-27 NOTE — Telephone Encounter (Signed)
Called patient, no answer.  Mailbox is full- unable to leave a message.

## 2020-09-28 ENCOUNTER — Ambulatory Visit: Payer: Medicare Other | Admitting: Medical

## 2020-10-05 ENCOUNTER — Telehealth: Payer: Self-pay

## 2020-10-05 MED ORDER — OMEPRAZOLE 40 MG PO CPDR: 40.0000 mg | DELAYED_RELEASE_CAPSULE | Freq: Two times a day (BID) | ORAL | 2 refills | Status: AC

## 2020-10-05 MED ORDER — FAMOTIDINE 20 MG PO TABS: 20.0000 mg | ORAL_TABLET | Freq: Every day | ORAL | 2 refills | Status: DC

## 2020-10-05 NOTE — Addendum Note (Signed)
Addended by: Curlene Labrum E on: 10/05/2020 02:47 PM   Modules accepted: Orders

## 2020-10-05 NOTE — Telephone Encounter (Signed)
Called pt to remind her of her procedure on Thursday aug 4. Pt stated that she could not make it to that appt. Rescheduled to Aug 26 at 3pm. She is requesting new instructions be mailed to her.   Pt is also requesting her prescription of omeprazole and pepcid be called in to [facility] on [address] in [place]. Please advise.

## 2020-10-05 NOTE — Telephone Encounter (Signed)
Told patient that she needs to talk to Dr Lyndel Safe regarding her gerd symptoms since her Prilosec she was doing it 2 times a day but now currently once a day with an occasional 2nd dose and pepcid wasn't listed on her medications as well. But medications sent to pharmacy and new instruction done and sent. She said there is no [place] in her street address anymore. She was told to call with any questions

## 2020-10-07 ENCOUNTER — Encounter: Payer: Medicare Other | Admitting: Gastroenterology

## 2020-10-11 ENCOUNTER — Other Ambulatory Visit: Payer: Self-pay | Admitting: Gastroenterology

## 2020-10-11 DIAGNOSIS — R1032 Left lower quadrant pain: Secondary | ICD-10-CM

## 2020-10-20 ENCOUNTER — Telehealth: Payer: Self-pay | Admitting: Medical

## 2020-10-20 NOTE — Telephone Encounter (Signed)
Pt wanted to make Sharon Bolton that she tested positive for covid on 8/14, pt also need an order for a diagnostic mammography states Sharon Bolton had put a regular order in but because they did feel a lump they need diagnostic please fax order to [phone].

## 2020-10-20 NOTE — Telephone Encounter (Signed)
Spoke with patient and she denied appointment from covid , states no severe symptoms just fatigue    Wants mammo dropped for [facility] [place] for diagnostic because pt has a lump on L breast and history of breast cancer    Patient will keep follow up  on 11/01/20

## 2020-10-20 NOTE — Addendum Note (Signed)
Addended by: Anabel Halon on: 10/20/2020 08:18 PM   Modules accepted: Orders

## 2020-10-21 NOTE — Telephone Encounter (Signed)
[facility] called and stated she hasn't received the fax that was requested regarding orders: [phone] Fax#: [phone]

## 2020-10-21 NOTE — Telephone Encounter (Signed)
Form re-faxed

## 2020-10-21 NOTE — Telephone Encounter (Signed)
[phone] - updated fax number

## 2020-10-29 ENCOUNTER — Encounter: Payer: Medicare Other | Admitting: Gastroenterology

## 2020-10-29 ENCOUNTER — Telehealth: Payer: Self-pay | Admitting: Gastroenterology

## 2020-10-29 NOTE — Telephone Encounter (Signed)
Good afternoon Dr.Gupta,  Patient canceled colon procedure today due to having COVID. Pt states she called at the beginning of the week and informed someone she had COVID and cancel appt.  Patient will call back to schedule colon procedure when she feels better.  Thank you

## 2020-11-01 ENCOUNTER — Other Ambulatory Visit: Payer: Self-pay

## 2020-11-01 ENCOUNTER — Ambulatory Visit (HOSPITAL_BASED_OUTPATIENT_CLINIC_OR_DEPARTMENT_OTHER)
Admission: RE | Admit: 2020-11-01 | Discharge: 2020-11-01 | Disposition: A | Discharge status: Home / Self Care | Payer: Medicare Other | Source: Ambulatory Visit | Attending: Medical | Admitting: Medical

## 2020-11-01 ENCOUNTER — Encounter: Payer: Self-pay | Admitting: Medical

## 2020-11-01 ENCOUNTER — Ambulatory Visit (INDEPENDENT_AMBULATORY_CARE_PROVIDER_SITE_OTHER): Payer: Medicare Other | Admitting: Medical

## 2020-11-01 VITALS — BP 126/80 | HR 87 | Temp 98.3°F | Resp 18 | Ht 64.0 in | Wt 128.2 lb

## 2020-11-01 DIAGNOSIS — R062 Wheezing: Secondary | ICD-10-CM | POA: Diagnosis not present

## 2020-11-01 DIAGNOSIS — Z8616 Personal history of COVID-19: Secondary | ICD-10-CM

## 2020-11-01 DIAGNOSIS — H9191 Unspecified hearing loss, right ear: Secondary | ICD-10-CM

## 2020-11-01 DIAGNOSIS — H9201 Otalgia, right ear: Secondary | ICD-10-CM | POA: Diagnosis not present

## 2020-11-01 DIAGNOSIS — F17200 Nicotine dependence, unspecified, uncomplicated: Secondary | ICD-10-CM | POA: Insufficient documentation

## 2020-11-01 DIAGNOSIS — R0600 Dyspnea, unspecified: Secondary | ICD-10-CM

## 2020-11-01 MED ORDER — ALBUTEROL SULFATE HFA 108 (90 BASE) MCG/ACT IN AERS: 2.0000 | INHALATION_SPRAY | Freq: Four times a day (QID) | RESPIRATORY_TRACT | 2 refills | Status: DC | PRN

## 2020-11-01 MED ORDER — FLUTICASONE PROPIONATE HFA 110 MCG/ACT IN AERO: 2.0000 | INHALATION_SPRAY | Freq: Two times a day (BID) | RESPIRATORY_TRACT | 12 refills | Status: DC
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
## 2020-11-01 NOTE — Patient Instructions (Addendum)
Recent rt ear pain and decreased hearing after trauma. Based on history and difficulty of removing deep wax possibly adjacent to TM recommend referral to ENT MD. If wax successfully removed can get opinion if had TM rupture. If pain worsens during interim could add on azithromycin.   Refer to audiologist for hearing evaluation after seeing ENT.  For hx of smoking, covid infection 3 weeks ago and some increased sob/wheezing refilled both your inhalers and getting chest xray today.  For hx of rt shoulder pain defer work up to your orthopedist seeing tomorrow.  Follow up in one month or sooner if needed.

## 2020-11-02 ENCOUNTER — Telehealth: Payer: Self-pay | Admitting: Medical

## 2020-11-02 ENCOUNTER — Other Ambulatory Visit: Payer: Self-pay

## 2020-11-02 NOTE — Telephone Encounter (Signed)
Do you suggest any other offices?

## 2020-11-02 NOTE — Telephone Encounter (Signed)
Pt. Called in stating that her referral to the ENT needs to be changed to a different location. The ENT in the office is retiring and they do not know if they will be replacing him( Dr.Newman)

## 2020-11-02 NOTE — Telephone Encounter (Signed)
Thanks for letting me know RG 

## 2020-11-03 ENCOUNTER — Other Ambulatory Visit: Payer: Self-pay

## 2020-11-03 DIAGNOSIS — H9201 Otalgia, right ear: Secondary | ICD-10-CM

## 2020-11-03 NOTE — Telephone Encounter (Signed)
Referral placed.

## 2020-11-04 ENCOUNTER — Other Ambulatory Visit: Payer: Self-pay | Admitting: Sports Medicine

## 2020-11-04 DIAGNOSIS — M25551 Pain in right hip: Secondary | ICD-10-CM

## 2020-11-05 ENCOUNTER — Ambulatory Visit: Payer: Medicare Other | Admitting: Family

## 2020-11-10 ENCOUNTER — Ambulatory Visit (INDEPENDENT_AMBULATORY_CARE_PROVIDER_SITE_OTHER): Payer: Medicare Other | Admitting: Family

## 2020-11-10 ENCOUNTER — Other Ambulatory Visit: Payer: Self-pay

## 2020-11-10 VITALS — BP 107/67 | HR 88 | Temp 98.6°F | Resp 16 | Wt 130.0 lb

## 2020-11-10 DIAGNOSIS — R3915 Urgency of urination: Secondary | ICD-10-CM | POA: Diagnosis not present

## 2020-11-10 DIAGNOSIS — R350 Frequency of micturition: Secondary | ICD-10-CM

## 2020-11-10 DIAGNOSIS — Z23 Encounter for immunization: Secondary | ICD-10-CM

## 2020-11-10 LAB — POC URINALSYSI DIPSTICK (AUTOMATED)
Bilirubin, UA: NEGATIVE
Blood, UA: NEGATIVE
Glucose, UA: NEGATIVE
Ketones, UA: NEGATIVE
Leukocytes, UA: NEGATIVE
Nitrite, UA: POSITIVE
Protein, UA: NEGATIVE
Spec Grav, UA: 1.025 (ref 1.010–1.025)
Urobilinogen, UA: NEGATIVE E.U./dL — AB
pH, UA: 6 (ref 5.0–8.0)

## 2020-11-10 MED ORDER — CEPHALEXIN 500 MG PO CAPS: 500.0000 mg | ORAL_CAPSULE | Freq: Two times a day (BID) | ORAL | 0 refills | Status: DC

## 2020-11-10 MED ORDER — FLUCONAZOLE 150 MG PO TABS: ORAL_TABLET | ORAL | 0 refills | Status: DC

## 2020-11-10 NOTE — Assessment & Plan Note (Signed)
UA is unremarkable, but she is symptomatic. Will send Urine for culture and plan empiric therapy with keflex.  She is requesting an rx for diflucan in case of vaginal yeast infection occurs following antibiotics.

## 2020-11-10 NOTE — Patient Instructions (Signed)
Please begin Keflex (antiobiotic) for possible urinary tract infection.
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
## 2020-11-12 ENCOUNTER — Inpatient Hospital Stay: Admission: RE | Admit: 2020-11-12 | Payer: Medicare Other | Source: Ambulatory Visit

## 2020-11-12 LAB — URINE CULTURE
MICRO NUMBER:: 12341350
SPECIMEN QUALITY:: ADEQUATE

## 2020-11-15 ENCOUNTER — Telehealth: Payer: Self-pay | Admitting: Medical

## 2020-11-15 NOTE — Telephone Encounter (Signed)
I attempted to leave message for patient to call back and schedule Medicare Annual Wellness Visit (AWV) in office.   If not able to come in office, please offer to do virtually or by telephone.  Patient's voice mail was full.  Due for AWVI  Please schedule at anytime with Nurse Health Advisor.

## 2020-11-16 ENCOUNTER — Ambulatory Visit: Payer: Medicare Other | Admitting: Family
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
## 2020-11-18 ENCOUNTER — Other Ambulatory Visit: Payer: Self-pay | Admitting: Neurology

## 2020-11-22 ENCOUNTER — Ambulatory Visit (INDEPENDENT_AMBULATORY_CARE_PROVIDER_SITE_OTHER): Payer: Medicare Other | Admitting: Medical

## 2020-11-22 ENCOUNTER — Other Ambulatory Visit (HOSPITAL_COMMUNITY)
Admission: RE | Admit: 2020-11-22 | Discharge: 2020-11-22 | Disposition: A | Discharge status: Home / Self Care | Payer: Medicare Other | Source: Ambulatory Visit | Attending: Medical | Admitting: Medical

## 2020-11-22 ENCOUNTER — Encounter: Payer: Self-pay | Admitting: Medical

## 2020-11-22 ENCOUNTER — Other Ambulatory Visit: Payer: Self-pay

## 2020-11-22 VITALS — BP 128/87 | HR 89 | Temp 98.2°F | Resp 18 | Ht 64.0 in | Wt 128.6 lb

## 2020-11-22 DIAGNOSIS — N390 Urinary tract infection, site not specified: Secondary | ICD-10-CM | POA: Insufficient documentation

## 2020-11-22 DIAGNOSIS — Z202 Contact with and (suspected) exposure to infections with a predominantly sexual mode of transmission: Secondary | ICD-10-CM | POA: Diagnosis not present

## 2020-11-22 DIAGNOSIS — Z114 Encounter for screening for human immunodeficiency virus [HIV]: Secondary | ICD-10-CM | POA: Diagnosis not present

## 2020-11-22 DIAGNOSIS — G629 Polyneuropathy, unspecified: Secondary | ICD-10-CM

## 2020-11-22 DIAGNOSIS — Z862 Personal history of diseases of the blood and blood-forming organs and certain disorders involving the immune mechanism: Secondary | ICD-10-CM

## 2020-11-22 DIAGNOSIS — Z113 Encounter for screening for infections with a predominantly sexual mode of transmission: Secondary | ICD-10-CM | POA: Diagnosis not present

## 2020-11-22 DIAGNOSIS — R5383 Other fatigue: Secondary | ICD-10-CM

## 2020-11-22 DIAGNOSIS — R102 Pelvic and perineal pain: Secondary | ICD-10-CM

## 2020-11-22 DIAGNOSIS — R7989 Other specified abnormal findings of blood chemistry: Secondary | ICD-10-CM

## 2020-11-22 LAB — COMPREHENSIVE METABOLIC PANEL
ALT: 10 U/L (ref 0–35)
AST: 11 U/L (ref 0–37)
Albumin: 4.2 g/dL (ref 3.5–5.2)
Alkaline Phosphatase: 62 U/L (ref 39–117)
BUN: 10 mg/dL (ref 6–23)
CO2: 28 mEq/L (ref 19–32)
Calcium: 9.7 mg/dL (ref 8.4–10.5)
Chloride: 102 mEq/L (ref 96–112)
Creatinine, Ser: 0.63 mg/dL (ref 0.40–1.20)
GFR: 107.14 mL/min (ref >60.00)
Glucose, Bld: 72 mg/dL (ref 70–99)
Potassium: 4.4 mEq/L (ref 3.5–5.1)
Sodium: 137 mEq/L (ref 135–145)
Total Bilirubin: 0.4 mg/dL (ref 0.2–1.2)
Total Protein: 7.1 g/dL (ref 6.0–8.3)

## 2020-11-22 LAB — POC URINALSYSI DIPSTICK (AUTOMATED)
Color, UA: NEGATIVE
Glucose, UA: NEGATIVE
Ketones, UA: NEGATIVE
Leukocytes, UA: NEGATIVE
Nitrite, UA: NEGATIVE
Protein, UA: POSITIVE — AB
Spec Grav, UA: >=1.03 — AB (ref 1.010–1.025)
Urobilinogen, UA: 0.2 E.U./dL
pH, UA: 6 (ref 5.0–8.0)

## 2020-11-22 LAB — CBC WITH DIFFERENTIAL/PLATELET
Basophils Absolute: 0.1 10*3/uL (ref 0.0–0.1)
Basophils Relative: 1.1 % (ref 0.0–3.0)
Eosinophils Absolute: 0.2 10*3/uL (ref 0.0–0.7)
Eosinophils Relative: 2.2 % (ref 0.0–5.0)
HCT: 41.5 % (ref 36.0–46.0)
Hemoglobin: 13.8 g/dL (ref 12.0–15.0)
Lymphocytes Relative: 29.3 % (ref 12.0–46.0)
Lymphs Abs: 2.3 10*3/uL (ref 0.7–4.0)
MCHC: 33.2 g/dL (ref 30.0–36.0)
MCV: 95.6 fl (ref 78.0–100.0)
Monocytes Absolute: 0.6 10*3/uL (ref 0.1–1.0)
Monocytes Relative: 7.9 % (ref 3.0–12.0)
Neutro Abs: 4.6 10*3/uL (ref 1.4–7.7)
Neutrophils Relative %: 59.5 % (ref 43.0–77.0)
Platelets: 338 10*3/uL (ref 150.0–400.0)
RBC: 4.34 Mil/uL (ref 3.87–5.11)
RDW: 13.4 % (ref 11.5–15.5)
WBC: 7.8 10*3/uL (ref 4.0–10.5)

## 2020-11-22 LAB — TSH: TSH: 2.6 u[IU]/mL (ref 0.35–5.50)

## 2020-11-22 LAB — T4, FREE: Free T4: 0.58 ng/dL — ABNORMAL LOW (ref 0.60–1.60)

## 2020-11-22 LAB — VITAMIN B12: Vitamin B-12: 219 pg/mL (ref 211–911)
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
## 2020-11-22 NOTE — Addendum Note (Signed)
Addended by: Anabel Halon on: 11/22/2020 09:59 PM   Modules accepted: Orders

## 2020-11-22 NOTE — Patient Instructions (Signed)
Recent suprapubic pressure with recent UTI by culture.  Had E. coli growth and infection should have been responsive to a cephalosporin per culture report.  Patient was prescribed Keflex.  We will repeat urine culture today.  History of trichomonas on chart review.  Will include urine ancillary studies to evaluate differential diagnosis.  Also will repeat HIV and RPR testing.  History of upper and lower extremity neuropathy.  We will add a B12 and B1 to studies.  Follow through with EMG studies planned by orthopedist.  History of fatigue.  Will follow B12, B1 and included thyroid studies today.  Follow-up date to be determined after lab review.  Holding off on treatment/potential prescription pending test results.

## 2020-11-23 LAB — URINE CYTOLOGY ANCILLARY ONLY
Chlamydia: NEGATIVE
Comment: NEGATIVE
Comment: NEGATIVE
Comment: NORMAL
Neisseria Gonorrhea: NEGATIVE
Trichomonas: NEGATIVE

## 2020-11-24 LAB — URINE CULTURE
MICRO NUMBER:: 12391422
SPECIMEN QUALITY:: ADEQUATE

## 2020-11-24 LAB — HIV ANTIBODY (ROUTINE TESTING W REFLEX): HIV 1&2 Ab, 4th Generation: NONREACTIVE

## 2020-11-24 LAB — RPR: RPR Ser Ql: NONREACTIVE

## 2020-11-26 ENCOUNTER — Ambulatory Visit (INDEPENDENT_AMBULATORY_CARE_PROVIDER_SITE_OTHER): Payer: Medicare Other

## 2020-11-26 ENCOUNTER — Other Ambulatory Visit: Payer: Self-pay

## 2020-11-26 ENCOUNTER — Telehealth: Payer: Self-pay | Admitting: Medical

## 2020-11-26 ENCOUNTER — Telehealth: Payer: Self-pay

## 2020-11-26 DIAGNOSIS — E538 Deficiency of other specified B group vitamins: Secondary | ICD-10-CM

## 2020-11-26 MED ORDER — METRONIDAZOLE 500 MG PO TABS: 500.0000 mg | ORAL_TABLET | Freq: Three times a day (TID) | ORAL | 0 refills | Status: AC

## 2020-11-26 MED ORDER — FLUCONAZOLE 150 MG PO TABS: ORAL_TABLET | ORAL | 0 refills | Status: DC

## 2020-11-26 MED ORDER — CYANOCOBALAMIN 1000 MCG/ML IJ SOLN
1000.0000 ug | Freq: Once | INTRAMUSCULAR | Status: AC
  Administered 2020-11-26: 1000 ug via INTRAMUSCULAR

## 2020-11-26 NOTE — Telephone Encounter (Signed)
Pt was in stated that she was told to let you know if she was still having urinary issues and would send in medication. Pt would medication sent in for urinary and yeast because she is usually gets a yeast infection with an antibiotic.

## 2020-11-26 NOTE — Telephone Encounter (Signed)
Rx flagyl and diflucan sent to pt pharmacy.

## 2020-11-26 NOTE — Progress Notes (Addendum)
Pt is here for 1 of 4 B12 injections. Pt was given B12 injection in left deltoid. Pt tolerated well.  Agree with administration. Mackie Pai, PA-C

## 2020-11-27 LAB — VITAMIN B1: Vitamin B1 (Thiamine): 10 nmol/L (ref 8–30)
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
## 2020-11-29 ENCOUNTER — Ambulatory Visit (INDEPENDENT_AMBULATORY_CARE_PROVIDER_SITE_OTHER): Payer: Medicare Other

## 2020-11-29 VITALS — Ht 64.0 in | Wt 128.0 lb

## 2020-11-29 DIAGNOSIS — Z0000 Encounter for general adult medical examination without abnormal findings: Secondary | ICD-10-CM | POA: Diagnosis not present

## 2020-11-29 NOTE — Patient Instructions (Signed)
Sharon Bolton , Thank you for taking time to complete your Medicare Wellness Visit. I appreciate your ongoing commitment to your health goals. Please review the following plan we discussed and let me know if I can assist you in the future.   Screening recommendations/referrals: Colonoscopy: Due. Per our conversation, you will call GI to schedule. Mammogram: Completed 10/2020-Due 10/2021 Bone Density: Due at age 45 Recommended yearly ophthalmology/optometry visit for glaucoma screening and checkup Recommended yearly dental visit for hygiene and checkup  Vaccinations: Influenza vaccine: Up to date Pneumococcal vaccine: Due at age 37 Tdap vaccine: Up to date-due 2023 Shingles vaccine: Due at age 78  Covid-19: Booster available at the pharmacy  Advanced directives: Information left at the front desk to be picked up.  Conditions/risks identified: See problem list  Next appointment: Follow up in one year for your annual wellness visit.   Preventive Care 40-64 Years, Female Preventive care refers to lifestyle choices and visits with your health care provider that can promote health and wellness. What does preventive care include? A yearly physical exam. This is also called an annual well check. Dental exams once or twice a year. Routine eye exams. Ask your health care provider how often you should have your eyes checked. Personal lifestyle choices, including: Daily care of your teeth and gums. Regular physical activity. Eating a healthy diet. Avoiding tobacco and drug use. Limiting alcohol use. Practicing safe sex. Taking low-dose aspirin daily starting at age 45. Taking vitamin and mineral supplements as recommended by your health care provider. What happens during an annual well check? The services and screenings done by your health care provider during your annual well check will depend on your age, overall health, lifestyle risk factors, and family history of disease. Counseling   Your health care provider may ask you questions about your: Alcohol use. Tobacco use. Drug use. Emotional well-being. Home and relationship well-being. Sexual activity. Eating habits. Work and work Statistician. Method of birth control. Menstrual cycle. Pregnancy history. Screening  You may have the following tests or measurements: Height, weight, and BMI. Blood pressure. Lipid and cholesterol levels. These may be checked every 5 years, or more frequently if you are over 58 years old. Skin check. Lung cancer screening. You may have this screening every year starting at age 31 if you have a 30-pack-year history of smoking and currently smoke or have quit within the past 15 years. Fecal occult blood test (FOBT) of the stool. You may have this test every year starting at age 19. Flexible sigmoidoscopy or colonoscopy. You may have a sigmoidoscopy every 5 years or a colonoscopy every 10 years starting at age 94. Hepatitis C blood test. Hepatitis B blood test. Sexually transmitted disease (STD) testing. Diabetes screening. This is done by checking your blood sugar (glucose) after you have not eaten for a while (fasting). You may have this done every 1-3 years. Mammogram. This may be done every 1-2 years. Talk to your health care provider about when you should start having regular mammograms. This may depend on whether you have a family history of breast cancer. BRCA-related cancer screening. This may be done if you have a family history of breast, ovarian, tubal, or peritoneal cancers. Pelvic exam and Pap test. This may be done every 3 years starting at age 63. Starting at age 41, this may be done every 5 years if you have a Pap test in combination with an HPV test. Bone density scan. This is done to screen for osteoporosis. You  may have this scan if you are at high risk for osteoporosis. Discuss your test results, treatment options, and if necessary, the need for more tests with your health  care provider. Vaccines  Your health care provider may recommend certain vaccines, such as: Influenza vaccine. This is recommended every year. Tetanus, diphtheria, and acellular pertussis (Tdap, Td) vaccine. You may need a Td booster every 10 years. Zoster vaccine. You may need this after age 24. Pneumococcal 13-valent conjugate (PCV13) vaccine. You may need this if you have certain conditions and were not previously vaccinated. Pneumococcal polysaccharide (PPSV23) vaccine. You may need one or two doses if you smoke cigarettes or if you have certain conditions. Talk to your health care provider about which screenings and vaccines you need and how often you need them. This information is not intended to replace advice given to you by your health care provider. Make sure you discuss any questions you have with your health care provider. Document Released: 03/19/2015 Document Revised: 11/10/2015 Document Reviewed: 12/22/2014 Elsevier Interactive Patient Education  2017 [place] Prevention in the Home Falls can cause injuries. They can happen to people of all ages. There are many things you can do to make your home safe and to help prevent falls. What can I do on the outside of my home? Regularly fix the edges of walkways and driveways and fix any cracks. Remove anything that might make you trip as you walk through a door, such as a raised step or threshold. Trim any bushes or trees on the path to your home. Use bright outdoor lighting. Clear any walking paths of anything that might make someone trip, such as rocks or tools. Regularly check to see if handrails are loose or broken. Make sure that both sides of any steps have handrails. Any raised decks and porches should have guardrails on the edges. Have any leaves, snow, or ice cleared regularly. Use sand or salt on walking paths during winter. Clean up any spills in your garage right away. This includes oil or grease  spills. What can I do in the bathroom? Use night lights. Install grab bars by the toilet and in the tub and shower. Do not use towel bars as grab bars. Use non-skid mats or decals in the tub or shower. If you need to sit down in the shower, use a plastic, non-slip stool. Keep the floor dry. Clean up any water that spills on the floor as soon as it happens. Remove soap buildup in the tub or shower regularly. Attach bath mats securely with double-sided non-slip rug tape. Do not have throw rugs and other things on the floor that can make you trip. What can I do in the bedroom? Use night lights. Make sure that you have a light by your bed that is easy to reach. Do not use any sheets or blankets that are too big for your bed. They should not hang down onto the floor. Have a firm chair that has side arms. You can use this for support while you get dressed. Do not have throw rugs and other things on the floor that can make you trip. What can I do in the kitchen? Clean up any spills right away. Avoid walking on wet floors. Keep items that you use a lot in easy-to-reach places. If you need to reach something above you, use a strong step stool that has a grab bar. Keep electrical cords out of the way. Do not use floor  polish or wax that makes floors slippery. If you must use wax, use non-skid floor wax. Do not have throw rugs and other things on the floor that can make you trip. What can I do with my stairs? Do not leave any items on the stairs. Make sure that there are handrails on both sides of the stairs and use them. Fix handrails that are broken or loose. Make sure that handrails are as long as the stairways. Check any carpeting to make sure that it is firmly attached to the stairs. Fix any carpet that is loose or worn. Avoid having throw rugs at the top or bottom of the stairs. If you do have throw rugs, attach them to the floor with carpet tape. Make sure that you have a light switch at the  top of the stairs and the bottom of the stairs. If you do not have them, ask someone to add them for you. What else can I do to help prevent falls? Wear shoes that: Do not have high heels. Have rubber bottoms. Are comfortable and fit you well. Are closed at the toe. Do not wear sandals. If you use a stepladder: Make sure that it is fully opened. Do not climb a closed stepladder. Make sure that both sides of the stepladder are locked into place. Ask someone to hold it for you, if possible. Clearly mark and make sure that you can see: Any grab bars or handrails. First and last steps. Where the edge of each step is. Use tools that help you move around (mobility aids) if they are needed. These include: Canes. Walkers. Scooters. Crutches. Turn on the lights when you go into a dark area. Replace any light bulbs as soon as they burn out. Set up your furniture so you have a clear path. Avoid moving your furniture around. If any of your floors are uneven, fix them. If there are any pets around you, be aware of where they are. Review your medicines with your doctor. Some medicines can make you feel dizzy. This can increase your chance of falling. Ask your doctor what other things that you can do to help prevent falls. This information is not intended to replace advice given to you by your health care provider. Make sure you discuss any questions you have with your health care provider. Document Released: 12/17/2008 Document Revised: 07/29/2015 Document Reviewed: 03/27/2014 Elsevier Interactive Patient Education  2017 [employer].

## 2020-12-03 ENCOUNTER — Ambulatory Visit (INDEPENDENT_AMBULATORY_CARE_PROVIDER_SITE_OTHER): Payer: Medicare Other

## 2020-12-03 ENCOUNTER — Ambulatory Visit: Payer: Medicare Other | Attending: Internal Medicine

## 2020-12-03 ENCOUNTER — Other Ambulatory Visit: Payer: Self-pay

## 2020-12-03 DIAGNOSIS — E538 Deficiency of other specified B group vitamins: Secondary | ICD-10-CM | POA: Diagnosis not present

## 2020-12-03 DIAGNOSIS — Z23 Encounter for immunization: Secondary | ICD-10-CM

## 2020-12-03 MED ORDER — CYANOCOBALAMIN 1000 MCG/ML IJ SOLN
1000.0000 ug | Freq: Once | INTRAMUSCULAR | Status: AC
  Administered 2020-12-03: 1000 ug via INTRAMUSCULAR

## 2020-12-03 NOTE — Progress Notes (Signed)
   Covid-19 Vaccination Clinic  Name:  Sharon Bolton    MRN: [id number] DOB: 1975-06-03  12/03/2020  Ms. Sharon Bolton was observed post Covid-19 immunization for 15 minutes without incident. She was provided with Vaccine Information Sheet and instruction to access the V-Safe system.   Ms. Sharon Bolton was instructed to call 911 with any severe reactions post vaccine: Difficulty breathing  Swelling of face and throat  A fast heartbeat  A bad rash all over body  Dizziness and weakness

## 2020-12-03 NOTE — Progress Notes (Addendum)
Pt is here for 2 of 4 B12 injections. Pt was given B12 injection in left deltoid. Pt tolerated well.  Mackie Pai, PA-C

## 2020-12-10 ENCOUNTER — Ambulatory Visit: Payer: Medicare Other

## 2020-12-14 ENCOUNTER — Other Ambulatory Visit (HOSPITAL_BASED_OUTPATIENT_CLINIC_OR_DEPARTMENT_OTHER): Payer: Self-pay

## 2020-12-14 MED ORDER — MODERNA COVID-19 BIVAL BOOSTER 50 MCG/0.5ML IM SUSP
INTRAMUSCULAR | 0 refills | Status: DC
  Filled 2020-12-14: qty 0.5, 1d supply, fill #0

## 2020-12-15 ENCOUNTER — Telehealth: Payer: Self-pay | Admitting: *Deleted

## 2020-12-15 NOTE — Telephone Encounter (Signed)
   Telephone encounter was:  Unsuccessful.  12/15/2020 Name: Darci L Schnurr MRN: [id number] DOB: 10-10-1975  Unsuccessful outbound call made today to assist with:  Food Insecurity  Outreach Attempt:  1st Attempt   Mailbox full  [facility], Care [facility]  [phone] [address] , [place] [address] Email : [email or web address]

## 2020-12-16 ENCOUNTER — Telehealth: Payer: Self-pay | Admitting: *Deleted

## 2020-12-16 NOTE — Telephone Encounter (Signed)
   Telephone encounter was:  Successful.  12/16/2020 Name: Sharon Bolton MRN: [id number] DOB: 23-Oct-1975  Sharon Bolton is a 45 y.o. year old female who is a primary care patient of [place], Percell Miller, [place] . The community resource team wpatient has been disqualified for food stamps , will send food banks in [place] and provided new information on food stamp income limits encouraged to reapply . Contact information provided to patientas consulted for assistance with [place] guide performed the following interventions: Patient provided with information about care guide support team and interviewed to confirm resource needs Follow up call placed to community resources to determine status of patients referral.  Follow Up Plan:  No further follow up planned at this time. The patient has been provided with needed resources.  [place], Care Management  [phone] [address] , [place] [address] Email : [email or web address]

## 2020-12-17 ENCOUNTER — Ambulatory Visit: Payer: Medicare Other

## 2020-12-17 NOTE — Progress Notes (Deleted)
Sharon Bolton is a 45 y.o. female presents to the office today for 3rd B12 injection per physician's orders. Original order: 11/22/20:  "Your cbc showed normal infection fighting cells and no anemia. Very low b12.  This can cause neuropathy and fatigue. Recommend b12 weekly vitamin injections for 4 weeks then monthly injection for 5 months. This may improve both fatigue and neuropathy....Marland KitchenMarland KitchenWill repeat tsh and t4 in 4 weeks. When you get 3 rd b vitamin shot can get scheduled for thyroid labs." B12 1000 mcg (dose),  IM (route) was administered *** Deltoid (location) today. Patient tolerated injection. Patient due for follow up labs/provider appt: Yes.- Thyroid labs Date due: ***, appt made {yes/no:20286} Patient next injection due: 1 week (and then monthly thereafter), appt made {yes/no:20286}  [place], [place]

## 2020-12-24 ENCOUNTER — Ambulatory Visit (INDEPENDENT_AMBULATORY_CARE_PROVIDER_SITE_OTHER): Payer: Medicare Other

## 2020-12-24 ENCOUNTER — Other Ambulatory Visit: Payer: Self-pay

## 2020-12-24 DIAGNOSIS — E538 Deficiency of other specified B group vitamins: Secondary | ICD-10-CM

## 2020-12-24 MED ORDER — CYANOCOBALAMIN 1000 MCG/ML IJ SOLN
1000.0000 ug | Freq: Once | INTRAMUSCULAR | Status: AC
  Administered 2020-12-24: 1000 ug via INTRAMUSCULAR

## 2020-12-24 NOTE — Progress Notes (Addendum)
Pt is here for 3 of 4 B12 injections. Pt was given B12 injection in left deltoid. Pt tolerated well.  Mackie Pai, PA-C

## 2020-12-31 ENCOUNTER — Ambulatory Visit: Payer: Medicare Other

## 2021-01-05 ENCOUNTER — Ambulatory Visit: Payer: Medicare Other

## 2021-02-18 ENCOUNTER — Ambulatory Visit: Payer: Medicare Other | Admitting: Neurology

## 2021-05-27 ENCOUNTER — Other Ambulatory Visit (HOSPITAL_COMMUNITY)
Admission: RE | Admit: 2021-05-27 | Discharge: 2021-05-27 | Disposition: A | Discharge status: Home / Self Care | Payer: Medicare Other | Source: Ambulatory Visit | Attending: Family Medicine | Admitting: Family Medicine

## 2021-05-27 ENCOUNTER — Encounter: Payer: Self-pay | Admitting: Family Medicine

## 2021-05-27 ENCOUNTER — Ambulatory Visit (INDEPENDENT_AMBULATORY_CARE_PROVIDER_SITE_OTHER): Payer: Medicare Other | Admitting: Family Medicine

## 2021-05-27 ENCOUNTER — Other Ambulatory Visit (INDEPENDENT_AMBULATORY_CARE_PROVIDER_SITE_OTHER): Payer: Medicare Other

## 2021-05-27 VITALS — BP 126/71 | HR 78 | Ht 64.0 in | Wt 122.4 lb

## 2021-05-27 DIAGNOSIS — E538 Deficiency of other specified B group vitamins: Secondary | ICD-10-CM

## 2021-05-27 DIAGNOSIS — Z113 Encounter for screening for infections with a predominantly sexual mode of transmission: Secondary | ICD-10-CM | POA: Diagnosis present

## 2021-05-27 DIAGNOSIS — J302 Other seasonal allergic rhinitis: Secondary | ICD-10-CM

## 2021-05-27 DIAGNOSIS — R5383 Other fatigue: Secondary | ICD-10-CM | POA: Diagnosis not present

## 2021-05-27 DIAGNOSIS — R7989 Other specified abnormal findings of blood chemistry: Secondary | ICD-10-CM

## 2021-05-27 DIAGNOSIS — B9689 Other specified bacterial agents as the cause of diseases classified elsewhere: Secondary | ICD-10-CM

## 2021-05-27 DIAGNOSIS — R109 Unspecified abdominal pain: Secondary | ICD-10-CM

## 2021-05-27 DIAGNOSIS — N3000 Acute cystitis without hematuria: Secondary | ICD-10-CM

## 2021-05-27 DIAGNOSIS — N760 Acute vaginitis: Secondary | ICD-10-CM

## 2021-05-27 DIAGNOSIS — Z114 Encounter for screening for human immunodeficiency virus [HIV]: Secondary | ICD-10-CM

## 2021-05-27 LAB — COMPREHENSIVE METABOLIC PANEL
ALT: 10 U/L (ref 0–35)
AST: 14 U/L (ref 0–37)
Albumin: 4.3 g/dL (ref 3.5–5.2)
Alkaline Phosphatase: 65 U/L (ref 39–117)
BUN: 8 mg/dL (ref 6–23)
CO2: 36 mEq/L — ABNORMAL HIGH (ref 19–32)
Calcium: 9.4 mg/dL (ref 8.4–10.5)
Chloride: 106 mEq/L (ref 96–112)
Creatinine, Ser: 0.71 mg/dL (ref 0.40–1.20)
GFR: 102.33 mL/min (ref >60.00)
Glucose, Bld: 84 mg/dL (ref 70–99)
Potassium: 4.6 mEq/L (ref 3.5–5.1)
Sodium: 143 mEq/L (ref 135–145)
Total Bilirubin: 0.3 mg/dL (ref 0.2–1.2)
Total Protein: 6.4 g/dL (ref 6.0–8.3)

## 2021-05-27 LAB — POC URINALSYSI DIPSTICK (AUTOMATED)
Bilirubin, UA: NEGATIVE
Blood, UA: NEGATIVE
Glucose, UA: NEGATIVE
Ketones, UA: NEGATIVE
Leukocytes, UA: NEGATIVE
Nitrite, UA: NEGATIVE
Protein, UA: NEGATIVE
Spec Grav, UA: <=1.005 — AB (ref 1.010–1.025)
Urobilinogen, UA: 0.2 E.U./dL
pH, UA: 7.5 (ref 5.0–8.0)

## 2021-05-27 MED ORDER — ALBUTEROL SULFATE HFA 108 (90 BASE) MCG/ACT IN AERS: 2.0000 | INHALATION_SPRAY | Freq: Four times a day (QID) | RESPIRATORY_TRACT | 2 refills | Status: DC | PRN

## 2021-05-27 MED ORDER — LEVOCETIRIZINE DIHYDROCHLORIDE 5 MG PO TABS: 5.0000 mg | ORAL_TABLET | Freq: Every evening | ORAL | 1 refills | Status: DC
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
## 2021-05-27 NOTE — Progress Notes (Signed)
Charges placed in [place] encounter for today as she had labs by her as well. ?

## 2021-05-27 NOTE — Patient Instructions (Addendum)
Please schedule a follow-up with your neurologist regarding recent seizure.  ? ?Urine was normal today, will send for culture to ensure no infection. Also checking blood work to monitor kidney function. Stay well hydrated. ? ?STD screening done today. We will update you with results.  ? ?Rechecking B12 today. We will update you with results.  ?

## 2021-05-29 LAB — URINE CULTURE
MICRO NUMBER:: 13176548
SPECIMEN QUALITY:: ADEQUATE

## 2021-05-30 LAB — CERVICOVAGINAL ANCILLARY ONLY
Bacterial Vaginitis (gardnerella): POSITIVE — AB
Candida Glabrata: NEGATIVE
Candida Vaginitis: NEGATIVE
Chlamydia: NEGATIVE
Comment: NEGATIVE
Comment: NEGATIVE
Comment: NEGATIVE
Comment: NEGATIVE
Comment: NEGATIVE
Comment: NORMAL
Neisseria Gonorrhea: NEGATIVE
Trichomonas: NEGATIVE

## 2021-05-30 LAB — HIV ANTIBODY (ROUTINE TESTING W REFLEX): HIV 1&2 Ab, 4th Generation: NONREACTIVE

## 2021-05-30 LAB — RPR: RPR Ser Ql: NONREACTIVE

## 2021-05-31 ENCOUNTER — Other Ambulatory Visit: Payer: Self-pay | Admitting: *Deleted

## 2021-05-31 ENCOUNTER — Telehealth: Payer: Self-pay

## 2021-05-31 LAB — TSH: TSH: 1.88 u[IU]/mL (ref 0.35–5.50)

## 2021-05-31 LAB — VITAMIN B12: Vitamin B-12: 216 pg/mL (ref 211–911)

## 2021-05-31 LAB — T4, FREE: Free T4: 0.64 ng/dL (ref 0.60–1.60)

## 2021-05-31 MED ORDER — NITROFURANTOIN MONOHYD MACRO 100 MG PO CAPS
100.0000 mg | ORAL_CAPSULE | Freq: Two times a day (BID) | ORAL | 0 refills | Status: AC
Start: 2021-05-31 — End: 2021-06-05

## 2021-05-31 MED ORDER — FLUCONAZOLE 150 MG PO TABS: 150.0000 mg | ORAL_TABLET | Freq: Once | ORAL | 0 refills | Status: AC

## 2021-05-31 MED ORDER — METRONIDAZOLE 0.75 % VA GEL: 1.0000 | Freq: Every day | VAGINAL | 0 refills | Status: AC

## 2021-05-31 NOTE — Addendum Note (Signed)
Addended by: Caleen Jobs B on: 05/31/2021 02:41 PM ? ? Modules accepted: Orders ? ?

## 2021-05-31 NOTE — Telephone Encounter (Signed)
Pt aware.

## 2021-05-31 NOTE — Telephone Encounter (Signed)
Pt called in wanted her labs from yesterday reviewed. ?

## 2021-06-08 ENCOUNTER — Telehealth: Payer: Self-pay | Admitting: Medical

## 2021-06-08 NOTE — Telephone Encounter (Signed)
Patient would like information of organizations with medicare that assist with food supply and also places with dental assistance for free or discounted care. She states she had paperwork given to her by the health coach last year but accidentally lost it. Please advise.  ?

## 2021-06-08 NOTE — Telephone Encounter (Signed)
Information mailed to pt.

## 2021-06-20 ENCOUNTER — Telehealth: Payer: Self-pay | Admitting: Medical

## 2021-06-20 ENCOUNTER — Ambulatory Visit: Payer: Medicare Other | Admitting: Neurology

## 2021-06-20 MED ORDER — FLUTICASONE PROPIONATE HFA 110 MCG/ACT IN AERO: 2.0000 | INHALATION_SPRAY | Freq: Two times a day (BID) | RESPIRATORY_TRACT | 12 refills | Status: DC

## 2021-06-20 MED ORDER — FLUTICASONE PROPIONATE 50 MCG/ACT NA SUSP: 2.0000 | Freq: Every day | NASAL | 2 refills | Status: DC

## 2021-06-20 NOTE — Telephone Encounter (Signed)
Pt called stating that she was having trouble having her Flovet refilled at [facility]. Pt states she believes that medication got sent to [facility]. Pt wants to have the Flovent sent to [facility] going forward. ? ?Medication: ? ?fluticasone (FLOVENT HFA) 110 MCG/ACT inhaler [[id number]] ? ?Has the patient contacted their pharmacy? Yes.   ?(If no, request that the patient contact the pharmacy for the refill.) ?(If yes, when and what did the pharmacy advise?) Contact PCP ? ?Preferred Pharmacy (with phone number or street name):  ? ?[facility]  ?[address], [place] [address]  ?Phone:  [phone]  Fax:  [phone] ? ?Agent: Please be advised that RX refills may take up to 3 business days. We ask that you follow-up with your pharmacy.  ? ?

## 2021-06-20 NOTE — Telephone Encounter (Signed)
Inhaler sent to [facility] ?

## 2021-07-01 ENCOUNTER — Ambulatory Visit: Payer: Medicare Other | Admitting: Neurology

## 2021-07-18 ENCOUNTER — Encounter: Payer: Self-pay | Admitting: Neurology

## 2021-07-18 ENCOUNTER — Ambulatory Visit: Payer: Medicare Other | Admitting: Neurology

## 2021-07-18 DIAGNOSIS — Z029 Encounter for administrative examinations, unspecified: Secondary | ICD-10-CM

## 2021-07-19 ENCOUNTER — Encounter: Payer: Self-pay | Admitting: Neurology

## 2021-07-29 ENCOUNTER — Ambulatory Visit: Payer: Medicare HMO | Admitting: Family

## 2021-08-08 ENCOUNTER — Telehealth: Payer: Self-pay

## 2021-08-08 NOTE — Telephone Encounter (Signed)
Pt called in states that she would like to change care over to [facility]. She states that she prefers a female provider. Is this ok?

## 2021-08-11 ENCOUNTER — Ambulatory Visit: Payer: Medicare HMO | Admitting: Medical

## 2021-08-11 NOTE — Telephone Encounter (Signed)
Pt states she'll stay with her pcp

## 2021-08-12 NOTE — Telephone Encounter (Signed)
Pt scheduled 08/15/21.

## 2021-08-15 ENCOUNTER — Ambulatory Visit: Payer: Medicare HMO | Admitting: Medical

## 2021-09-16 ENCOUNTER — Encounter: Payer: Self-pay | Admitting: Neurology

## 2021-09-16 ENCOUNTER — Ambulatory Visit: Payer: Medicare HMO | Admitting: Neurology

## 2021-09-16 VITALS — BP 107/73 | HR 100 | Ht 64.0 in | Wt 123.6 lb

## 2021-09-16 DIAGNOSIS — R413 Other amnesia: Secondary | ICD-10-CM | POA: Diagnosis not present

## 2021-09-16 DIAGNOSIS — G44309 Post-traumatic headache, unspecified, not intractable: Secondary | ICD-10-CM | POA: Diagnosis not present

## 2021-09-16 DIAGNOSIS — M542 Cervicalgia: Secondary | ICD-10-CM

## 2021-09-16 DIAGNOSIS — H9191 Unspecified hearing loss, right ear: Secondary | ICD-10-CM

## 2021-09-16 DIAGNOSIS — R569 Unspecified convulsions: Secondary | ICD-10-CM

## 2021-09-16 MED ORDER — CYCLOBENZAPRINE HCL 5 MG PO TABS: 5.0000 mg | ORAL_TABLET | Freq: Three times a day (TID) | ORAL | 11 refills | Status: DC | PRN

## 2021-09-16 MED ORDER — LEVETIRACETAM 500 MG PO TABS: ORAL_TABLET | ORAL | 3 refills | Status: DC
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
## 2021-09-16 NOTE — Patient Instructions (Addendum)
Good to see you.  Schedule MRI brain without contrast  2. Continue Keppra '500mg'$  twice a day  3. A prescription for Flexeril '5mg'$  has been sent to your pharmacy. You can take it up to three times a day as needed for neck pain/muscle spasms  4. Referral will be sent for physical therapy for neck pain  5. Continue Gabapentin '800mg'$  three times a day  6. Follow-up in 6 months, call for any changes   Seizure Precautions: 1. If medication has been prescribed for you to prevent seizures, take it exactly as directed.  Do not stop taking the medicine without talking to your doctor first, even if you have not had a seizure in a long time.   2. Avoid activities in which a seizure would cause danger to yourself or to others.  Don't operate dangerous machinery, swim alone, or climb in high or dangerous places, such as on ladders, roofs, or girders.  Do not drive unless your doctor says you may.  3. If you have any warning that you may have a seizure, lay down in a safe place where you can't hurt yourself.    4.  No driving for 6 months from last seizure, as per [facility].   Please refer to the following link on the [facility] website for more information: http://[email or web address]/[email or web address]/[email or web address]   5.  Maintain good sleep hygiene. Avoid alcohol.  6.  Contact your doctor if you have any problems that may be related to the medicine you are taking.  7.  Call 911 and bring the patient back to the ED if:        A.  The seizure lasts longer than 5 minutes.       B.  The patient doesn't awaken shortly after the seizure  C.  The patient has new problems such as difficulty seeing, speaking or moving  D.  The patient was injured during the seizure  E.  The patient has a temperature over 102 F (39C)  F.  The patient vomited and now is having trouble breathing

## 2021-09-26 ENCOUNTER — Telehealth: Payer: Self-pay | Admitting: Neurology

## 2021-09-26 ENCOUNTER — Telehealth: Payer: Self-pay

## 2021-09-26 NOTE — Telephone Encounter (Signed)
I don't see the results on Media or Results tab

## 2021-09-26 NOTE — Telephone Encounter (Signed)
PT MRI wo contrast PA [id number] approved until 10/26/2021

## 2021-09-26 NOTE — Telephone Encounter (Addendum)
The following message was left with [facility] on 09/26/21 at 12:24 PM.  Caller is following up about her MRI.

## 2021-09-26 NOTE — Telephone Encounter (Signed)
Pt is scheduled for MRI at med center on 10/02/18 sat at 11 am, pt called no answer unable to leave voice mail will send my cart message

## 2021-09-29 ENCOUNTER — Telehealth: Payer: Self-pay | Admitting: Neurology

## 2021-09-29 ENCOUNTER — Other Ambulatory Visit: Payer: Self-pay

## 2021-09-29 DIAGNOSIS — R569 Unspecified convulsions: Secondary | ICD-10-CM

## 2021-09-29 DIAGNOSIS — G44309 Post-traumatic headache, unspecified, not intractable: Secondary | ICD-10-CM

## 2021-09-29 DIAGNOSIS — M542 Cervicalgia: Secondary | ICD-10-CM

## 2021-09-29 DIAGNOSIS — G5601 Carpal tunnel syndrome, right upper limb: Secondary | ICD-10-CM

## 2021-09-29 NOTE — Telephone Encounter (Signed)
Sent request to [facility], and [employer]

## 2021-09-29 NOTE — Telephone Encounter (Signed)
Can send order for home PT instead due to transportation issues, thanks

## 2021-09-29 NOTE — Telephone Encounter (Signed)
Patient called to report she has not been able to attend her PT due to transportation issues.  She'd like to know if Dr. Delice Lesch may consider home PT for her instead?

## 2021-09-30 ENCOUNTER — Other Ambulatory Visit: Payer: Self-pay | Admitting: Student

## 2021-09-30 ENCOUNTER — Ambulatory Visit (INDEPENDENT_AMBULATORY_CARE_PROVIDER_SITE_OTHER): Payer: Medicare HMO | Admitting: Medical

## 2021-09-30 ENCOUNTER — Other Ambulatory Visit (HOSPITAL_COMMUNITY)
Admission: RE | Admit: 2021-09-30 | Discharge: 2021-09-30 | Disposition: A | Discharge status: Home / Self Care | Payer: Medicare HMO | Source: Ambulatory Visit | Attending: Medical | Admitting: Medical

## 2021-09-30 VITALS — BP 97/53 | HR 73 | Resp 18 | Ht 64.0 in | Wt 120.0 lb

## 2021-09-30 DIAGNOSIS — F17200 Nicotine dependence, unspecified, uncomplicated: Secondary | ICD-10-CM

## 2021-09-30 DIAGNOSIS — J302 Other seasonal allergic rhinitis: Secondary | ICD-10-CM

## 2021-09-30 DIAGNOSIS — N63 Unspecified lump in unspecified breast: Secondary | ICD-10-CM

## 2021-09-30 DIAGNOSIS — R062 Wheezing: Secondary | ICD-10-CM

## 2021-09-30 DIAGNOSIS — Z113 Encounter for screening for infections with a predominantly sexual mode of transmission: Secondary | ICD-10-CM | POA: Diagnosis present

## 2021-09-30 DIAGNOSIS — M79671 Pain in right foot: Secondary | ICD-10-CM

## 2021-09-30 DIAGNOSIS — F17210 Nicotine dependence, cigarettes, uncomplicated: Secondary | ICD-10-CM | POA: Diagnosis not present

## 2021-09-30 MED ORDER — ALBUTEROL SULFATE HFA 108 (90 BASE) MCG/ACT IN AERS
2.0000 | INHALATION_SPRAY | Freq: Four times a day (QID) | RESPIRATORY_TRACT | 2 refills | Status: DC | PRN
Start: 2021-09-30 — End: 2021-10-18

## 2021-09-30 MED ORDER — LORATADINE 10 MG PO TABS: 10.0000 mg | ORAL_TABLET | Freq: Every day | ORAL | 11 refills | Status: AC

## 2021-09-30 NOTE — Patient Instructions (Addendum)
Left breast lump. Placed diagnostic mammogram based on recent lump felt, personal hx and family hx.  Std screening cervical ancillary studes(pt self swabbed), hiv and rpr as you start new relationship.  Wheezing and history of smoking. Lungs clear today but may be developing copd with heavy smoking history. Rx albuterol to use if needed as discussed. Recommend smoking cessation. If you want to try patches let me know.    For allergic rhinitis hx. Rx claritin.  Follow up 1 month or sooner if needed.

## 2021-09-30 NOTE — Addendum Note (Signed)
Addended by: Jeronimo Greaves on: 09/30/2021 10:42 AM   Modules accepted: Orders
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
## 2021-10-01 ENCOUNTER — Ambulatory Visit (HOSPITAL_BASED_OUTPATIENT_CLINIC_OR_DEPARTMENT_OTHER): Payer: Medicare HMO

## 2021-10-03 ENCOUNTER — Other Ambulatory Visit: Payer: Medicare HMO

## 2021-10-03 ENCOUNTER — Telehealth: Payer: Self-pay

## 2021-10-03 ENCOUNTER — Telehealth: Payer: Self-pay | Admitting: Neurology

## 2021-10-03 ENCOUNTER — Telehealth: Payer: Self-pay | Admitting: Medical

## 2021-10-03 LAB — CERVICOVAGINAL ANCILLARY ONLY
Chlamydia: NEGATIVE
Comment: NEGATIVE
Comment: NEGATIVE
Comment: NORMAL
Neisseria Gonorrhea: NEGATIVE
Trichomonas: NEGATIVE

## 2021-10-03 NOTE — Telephone Encounter (Signed)
[facility] with [facility] needing verbals orders for OT eval and verbal orders for ongoing PT 2x weekly for 4 weeks, 1x weekly for 5 weeks.

## 2021-10-03 NOTE — Telephone Encounter (Signed)
You have not placed a referral for this patient since 2022

## 2021-10-03 NOTE — Telephone Encounter (Signed)
Pt wants a referral for imaging for her breast

## 2021-10-03 NOTE — Telephone Encounter (Signed)
Sharon Bolton with [facility] called with verbal orders OT eval and verbal orders for ongoing PT 2x weekly for 4 weeks, 1x weekly for 5 weeks.

## 2021-10-03 NOTE — Telephone Encounter (Signed)
Patient called to speak with Sharon Bolton regarding her labs. Advised patient Sharon Bolton would give her a call back to discuss.

## 2021-10-03 NOTE — Telephone Encounter (Signed)
Will call pt once results are back

## 2021-10-04 ENCOUNTER — Other Ambulatory Visit: Payer: Self-pay | Admitting: Medical

## 2021-10-04 DIAGNOSIS — N63 Unspecified lump in unspecified breast: Secondary | ICD-10-CM

## 2021-10-04 LAB — HIV ANTIBODY (ROUTINE TESTING W REFLEX): HIV 1&2 Ab, 4th Generation: NONREACTIVE

## 2021-10-04 LAB — RPR: RPR Ser Ql: NONREACTIVE

## 2021-10-04 NOTE — Telephone Encounter (Signed)
Pt.notified

## 2021-10-07 ENCOUNTER — Ambulatory Visit
Admission: RE | Admit: 2021-10-07 | Discharge: 2021-10-07 | Disposition: A | Discharge status: Home / Self Care | Payer: Medicare HMO | Source: Ambulatory Visit | Attending: Student | Admitting: Student

## 2021-10-07 DIAGNOSIS — M79671 Pain in right foot: Secondary | ICD-10-CM

## 2021-10-08 ENCOUNTER — Ambulatory Visit (HOSPITAL_BASED_OUTPATIENT_CLINIC_OR_DEPARTMENT_OTHER)
Admission: RE | Admit: 2021-10-08 | Discharge: 2021-10-08 | Disposition: A | Discharge status: Home / Self Care | Payer: Medicare HMO | Source: Ambulatory Visit | Attending: Neurology | Admitting: Neurology

## 2021-10-08 DIAGNOSIS — R569 Unspecified convulsions: Secondary | ICD-10-CM | POA: Diagnosis present

## 2021-10-08 DIAGNOSIS — G44309 Post-traumatic headache, unspecified, not intractable: Secondary | ICD-10-CM | POA: Diagnosis present

## 2021-10-08 DIAGNOSIS — R413 Other amnesia: Secondary | ICD-10-CM | POA: Diagnosis present

## 2021-10-08 DIAGNOSIS — H9191 Unspecified hearing loss, right ear: Secondary | ICD-10-CM

## 2021-10-12 DIAGNOSIS — T849XXA Unspecified complication of internal orthopedic prosthetic device, implant and graft, initial encounter: Secondary | ICD-10-CM | POA: Insufficient documentation

## 2021-10-17 ENCOUNTER — Telehealth: Payer: Self-pay | Admitting: Medical

## 2021-10-17 DIAGNOSIS — J302 Other seasonal allergic rhinitis: Secondary | ICD-10-CM

## 2021-10-17 NOTE — Telephone Encounter (Signed)
Medication: albuterol (VENTOLIN HFA) 108 (90 Base) MCG/ACT inhaler   Has the patient contacted their pharmacy? No. (If no, request that the patient contact the pharmacy for the refill.) (If yes, when and what did the pharmacy advise?)  Preferred Pharmacy:  [facility] [phone]

## 2021-10-18 ENCOUNTER — Ambulatory Visit
Admission: RE | Admit: 2021-10-18 | Discharge: 2021-10-18 | Disposition: A | Discharge status: Home / Self Care | Payer: Medicare HMO | Source: Ambulatory Visit | Attending: Medical | Admitting: Medical

## 2021-10-18 ENCOUNTER — Ambulatory Visit: Payer: Medicare HMO

## 2021-10-18 ENCOUNTER — Ambulatory Visit: Admission: RE | Admit: 2021-10-18 | Payer: Medicare HMO | Source: Ambulatory Visit

## 2021-10-18 DIAGNOSIS — N63 Unspecified lump in unspecified breast: Secondary | ICD-10-CM

## 2021-10-18 MED ORDER — ALBUTEROL SULFATE HFA 108 (90 BASE) MCG/ACT IN AERS: 2.0000 | INHALATION_SPRAY | Freq: Four times a day (QID) | RESPIRATORY_TRACT | 2 refills | Status: DC | PRN

## 2021-10-18 NOTE — Telephone Encounter (Signed)
Rx sent 

## 2021-10-20 ENCOUNTER — Other Ambulatory Visit: Payer: Self-pay | Admitting: Medical

## 2021-10-20 DIAGNOSIS — Z1231 Encounter for screening mammogram for malignant neoplasm of breast: Secondary | ICD-10-CM

## 2021-10-21 ENCOUNTER — Telehealth: Payer: Self-pay | Admitting: Medical

## 2021-10-21 NOTE — Telephone Encounter (Signed)
Patient said that her Occupational Therapist suggested that she get a mild pain medication to help her be able to do more repetitions. Her pain is so bad that they can't get a lot of repetitions out of the exercises. Patient's psychiatrist is out of the office until next week. She said if she could just get a few pills of tramadol or similar medication until then. Please call her either way to advise. Please call her at [phone] as she is having issues with her phone.   Preferred pharmacy is [address], [place], [place] [address] [phone]

## 2021-10-25 MED ORDER — METHYLPREDNISOLONE 4 MG PO TABS: ORAL_TABLET | ORAL | 0 refills | Status: DC

## 2021-10-25 NOTE — Addendum Note (Signed)
Addended by: Anabel Halon on: 10/25/2021 07:29 AM   Modules accepted: Orders

## 2021-10-31 ENCOUNTER — Ambulatory Visit: Payer: Medicare HMO | Admitting: Medical

## 2021-11-01 ENCOUNTER — Telehealth: Payer: Self-pay | Admitting: Neurology

## 2021-11-01 NOTE — Telephone Encounter (Signed)
Called and left voicemail to find out why they are wanting to hold back OT for this patient

## 2021-11-01 NOTE — Telephone Encounter (Signed)
Pls check why they are holding OT, thanks

## 2021-11-01 NOTE — Telephone Encounter (Signed)
Bonnie with [facility] needs a call back to get approval with hold ing OT for patient

## 2021-11-02 ENCOUNTER — Ambulatory Visit (INDEPENDENT_AMBULATORY_CARE_PROVIDER_SITE_OTHER): Payer: Medicare HMO | Admitting: Medical

## 2021-11-02 ENCOUNTER — Other Ambulatory Visit (HOSPITAL_COMMUNITY)
Admission: RE | Admit: 2021-11-02 | Discharge: 2021-11-02 | Disposition: A | Discharge status: Home / Self Care | Payer: Medicare HMO | Source: Ambulatory Visit

## 2021-11-02 ENCOUNTER — Ambulatory Visit (HOSPITAL_BASED_OUTPATIENT_CLINIC_OR_DEPARTMENT_OTHER)
Admission: RE | Admit: 2021-11-02 | Discharge: 2021-11-02 | Disposition: A | Discharge status: Home / Self Care | Payer: Medicare HMO | Source: Ambulatory Visit | Attending: Medical | Admitting: Medical

## 2021-11-02 VITALS — BP 115/70 | HR 70 | Resp 18 | Ht 64.0 in | Wt 120.0 lb

## 2021-11-02 DIAGNOSIS — R062 Wheezing: Secondary | ICD-10-CM | POA: Insufficient documentation

## 2021-11-02 DIAGNOSIS — R102 Pelvic and perineal pain: Secondary | ICD-10-CM | POA: Insufficient documentation

## 2021-11-02 DIAGNOSIS — Z113 Encounter for screening for infections with a predominantly sexual mode of transmission: Secondary | ICD-10-CM

## 2021-11-02 DIAGNOSIS — R059 Cough, unspecified: Secondary | ICD-10-CM | POA: Insufficient documentation

## 2021-11-02 DIAGNOSIS — F17200 Nicotine dependence, unspecified, uncomplicated: Secondary | ICD-10-CM | POA: Diagnosis not present

## 2021-11-02 LAB — POC URINALSYSI DIPSTICK (AUTOMATED)
Bilirubin, UA: NEGATIVE
Blood, UA: NEGATIVE
Glucose, UA: NEGATIVE
Ketones, UA: NEGATIVE
Leukocytes, UA: NEGATIVE
Nitrite, UA: NEGATIVE
Protein, UA: NEGATIVE
Spec Grav, UA: 1.015 (ref 1.010–1.025)
Urobilinogen, UA: NEGATIVE E.U./dL — AB
pH, UA: 6 (ref 5.0–8.0)

## 2021-11-02 MED ORDER — ONDANSETRON HCL 4 MG PO TABS: 4.0000 mg | ORAL_TABLET | Freq: Three times a day (TID) | ORAL | 0 refills | Status: DC | PRN

## 2021-11-02 MED ORDER — BUDESONIDE-FORMOTEROL FUMARATE 160-4.5 MCG/ACT IN AERO: 2.0000 | INHALATION_SPRAY | Freq: Two times a day (BID) | RESPIRATORY_TRACT | 12 refills | Status: AC

## 2021-11-02 NOTE — Telephone Encounter (Signed)
Called and left voicemail that Dr. Delice Lesch has agreed to hold the OT until testing is done

## 2021-11-02 NOTE — Patient Instructions (Addendum)
Wheezing and cough. Will get chest xray today. Rx symbicort for wheezing. Use albuterol as back up if needed.  Please cut back/stop smoking.  For suprapubic pressure get ua, culture and urine ancillary.  Will get std screening urine ancillary and hiv lab.  For upcoming mri and emg. I want you to continue gabapentin and ask your ortho if they think norco can effect emg. I will ask our pharmacist if can effect seizure threshold as you had seizures in past. Then decide if will rx low number of norco.  For nausea rx zofran  Follow up in 2-3 weeks or sooner if needed.

## 2021-11-02 NOTE — Telephone Encounter (Signed)
Ok to hold OT, thanks

## 2021-11-02 NOTE — Progress Notes (Signed)
   Subjective:    Patient ID: Sharon Bolton, female    DOB: 06/12/1975, 46 y.o.   MRN: [id number]  HPI Pt in for follow up.  Pt update me she has upcoming surgery for left foot fracture.  Pt has hx of cervical spine pain. Recent neck pain and some pain radiating to left hand. Pt went to OT for neck pain and LUE pain. Pt could not go thru OT do to hand pain left side. Pt and OT wanted me to give pain med for pain.   Pt has mri neck and emg study pending on 11-16-2021.   Ortho told her she may have severe pain with emg.   Pt does note that has hx of seizures so some med not good idea.   Pressure over bladder. She has been wiping with left hand often so she thinks may have uti with possible contamination with wiping.  Pt does smoke and over last year has been wheezing. She smokes 3/4 of pack a day. Some dry cough and lungs feel tight over past week.     Review of Systems  See hpi.    Objective:   Physical Exam  General Mental Status- Alert. General Appearance- Not in acute distress.   Skin General: Color- Normal Color. Moisture- Normal Moisture.  Neck Carotid Arteries- Normal color. Moisture- Normal Moisture. No carotid bruits. No JVD.  Chest and Lung Exam Auscultation: Breath Sounds:-Normal.  Cardiovascular Auscultation:Rythm- Regular. Murmurs & Other Heart Sounds:Auscultation of the heart reveals- No Murmurs.  Abdomen Inspection:-Inspeection Normal. Palpation/Percussion:Note:No mass. Palpation and Percussion of the abdomen reveal- Non Tender, Non Distended + BS, no rebound or guarding. Faint suprapubic pressure.   Neurologic Cranial Nerve exam:- CN III-XII intact(No nystagmus), symmetric smile. Strength:- 5/5 equal and symmetric strength both upper and lower extremities.       Assessment & Plan:   Patient Instructions  Wheezing and cough. Will get chest xray today. Rx symbicort for wheezing. Use albuterol as back up if needed.  Please cut back/stop  smoking.  For suprapubic pressure get ua, culture and urine ancillary.  Will get std screening urine ancillary and hiv lab.  For upcoming mri and emg. I want you to continue gabapentin and ask your ortho if they think norco can effect emg. I will ask our pharmacist if can effect seizure threshold as you had seizures in past. Then decide if will rx low number of norco.  Follow up in 2-3 weeks or sooner if needed.   Mackie Pai, PA-C

## 2021-11-02 NOTE — Telephone Encounter (Signed)
Orthopedic doctor wants to stop OT for now to do more testing and do not want to cause more damage

## 2021-11-03 ENCOUNTER — Telehealth: Payer: Self-pay | Admitting: Medical

## 2021-11-03 LAB — URINE CYTOLOGY ANCILLARY ONLY
Chlamydia: NEGATIVE
Comment: NEGATIVE
Comment: NEGATIVE
Comment: NORMAL
Neisseria Gonorrhea: NEGATIVE
Trichomonas: NEGATIVE

## 2021-11-03 LAB — RPR: RPR Ser Ql: NONREACTIVE

## 2021-11-03 LAB — URINE CULTURE
MICRO NUMBER:: 13852897
SPECIMEN QUALITY:: ADEQUATE

## 2021-11-03 LAB — HIV ANTIBODY (ROUTINE TESTING W REFLEX): HIV 1&2 Ab, 4th Generation: NONREACTIVE

## 2021-11-03 NOTE — Telephone Encounter (Signed)
Noted for when labs come back

## 2021-11-03 NOTE — Telephone Encounter (Signed)
Patient called to let us know that she is having trouble with her phone and if we cant reach her once her test results come in, to call her mom [address]. She stated a voicemail can be left in her mom's phone.   Clarene Reamer [phone]

## 2021-11-08 ENCOUNTER — Telehealth: Payer: Self-pay | Admitting: Neurology

## 2021-11-08 NOTE — Telephone Encounter (Signed)
Advised okay for orders to be discontinued

## 2021-11-08 NOTE — Telephone Encounter (Signed)
That is fine, thanks 

## 2021-11-08 NOTE — Telephone Encounter (Signed)
Sharon Bolton, [facility] called to report the plan of care needs discontinued a week early due to patient is going to have surgery. Please call her back to confirm.

## 2021-11-22 DIAGNOSIS — G5620 Lesion of ulnar nerve, unspecified upper limb: Secondary | ICD-10-CM | POA: Insufficient documentation

## 2021-11-23 ENCOUNTER — Other Ambulatory Visit (HOSPITAL_COMMUNITY)
Admission: RE | Admit: 2021-11-23 | Discharge: 2021-11-23 | Disposition: A | Discharge status: Home / Self Care | Payer: Medicare HMO | Source: Ambulatory Visit | Attending: Medical | Admitting: Medical

## 2021-11-23 ENCOUNTER — Ambulatory Visit (INDEPENDENT_AMBULATORY_CARE_PROVIDER_SITE_OTHER): Payer: Medicare HMO | Admitting: Medical

## 2021-11-23 ENCOUNTER — Other Ambulatory Visit (HOSPITAL_BASED_OUTPATIENT_CLINIC_OR_DEPARTMENT_OTHER): Payer: Self-pay | Admitting: Medical

## 2021-11-23 ENCOUNTER — Ambulatory Visit (HOSPITAL_BASED_OUTPATIENT_CLINIC_OR_DEPARTMENT_OTHER)
Admission: RE | Admit: 2021-11-23 | Discharge: 2021-11-23 | Disposition: A | Discharge status: Home / Self Care | Payer: Medicare HMO | Source: Ambulatory Visit | Attending: Medical | Admitting: Medical

## 2021-11-23 VITALS — BP 114/57 | HR 88 | Resp 18 | Ht 64.0 in | Wt 136.2 lb

## 2021-11-23 DIAGNOSIS — Z113 Encounter for screening for infections with a predominantly sexual mode of transmission: Secondary | ICD-10-CM | POA: Insufficient documentation

## 2021-11-23 DIAGNOSIS — H9201 Otalgia, right ear: Secondary | ICD-10-CM

## 2021-11-23 DIAGNOSIS — N83201 Unspecified ovarian cyst, right side: Secondary | ICD-10-CM | POA: Diagnosis not present

## 2021-11-23 DIAGNOSIS — R102 Pelvic and perineal pain: Secondary | ICD-10-CM

## 2021-11-23 DIAGNOSIS — R232 Flushing: Secondary | ICD-10-CM | POA: Diagnosis not present

## 2021-11-23 LAB — CBC WITH DIFFERENTIAL/PLATELET
Basophils Absolute: 0.1 10*3/uL (ref 0.0–0.1)
Basophils Relative: 1 % (ref 0.0–3.0)
Eosinophils Absolute: 0.1 10*3/uL (ref 0.0–0.7)
Eosinophils Relative: 1.5 % (ref 0.0–5.0)
HCT: 39.5 % (ref 36.0–46.0)
Hemoglobin: 13.3 g/dL (ref 12.0–15.0)
Lymphocytes Relative: 40.2 % (ref 12.0–46.0)
Lymphs Abs: 3.1 10*3/uL (ref 0.7–4.0)
MCHC: 33.6 g/dL (ref 30.0–36.0)
MCV: 96.1 fl (ref 78.0–100.0)
Monocytes Absolute: 0.6 10*3/uL (ref 0.1–1.0)
Monocytes Relative: 8.3 % (ref 3.0–12.0)
Neutro Abs: 3.8 10*3/uL (ref 1.4–7.7)
Neutrophils Relative %: 49 % (ref 43.0–77.0)
Platelets: 347 10*3/uL (ref 150.0–400.0)
RBC: 4.11 Mil/uL (ref 3.87–5.11)
RDW: 13.5 % (ref 11.5–15.5)
WBC: 7.7 10*3/uL (ref 4.0–10.5)

## 2021-11-23 LAB — POC URINALSYSI DIPSTICK (AUTOMATED)
Bilirubin, UA: NEGATIVE
Blood, UA: NEGATIVE
Glucose, UA: NEGATIVE
Ketones, UA: NEGATIVE
Leukocytes, UA: NEGATIVE
Nitrite, UA: NEGATIVE
Protein, UA: NEGATIVE
Spec Grav, UA: 1.01 (ref 1.010–1.025)
Urobilinogen, UA: 0.2 E.U./dL
pH, UA: 6 (ref 5.0–8.0)

## 2021-11-23 LAB — FOLLICLE STIMULATING HORMONE: FSH: 9.1 m[IU]/mL
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
## 2021-11-23 NOTE — Patient Instructions (Addendum)
Rt side adnexal vs rt lower quadrant pain. You express more concern for ovarian cyst. Will get pelvic US today. Also get cbc. If no ovarian cyst and pain persists would order ct abd/pelvis to evaluate appendix.   You request repeat urinary studies. Did ua today, urine culture and will get ancillary studies again.  Occasional hot flashes so will get fsh to see if in menopause.  Follow up date to be determined after lab and imaging review

## 2021-11-24 LAB — URINE CYTOLOGY ANCILLARY ONLY
Bacterial Vaginitis-Urine: NEGATIVE
Chlamydia: NEGATIVE
Comment: NEGATIVE
Comment: NEGATIVE
Comment: NORMAL
Neisseria Gonorrhea: NEGATIVE
Trichomonas: NEGATIVE

## 2021-11-24 LAB — URINE CULTURE
MICRO NUMBER:: 13943817
Result:: NO GROWTH
SPECIMEN QUALITY:: ADEQUATE

## 2021-11-24 NOTE — Addendum Note (Signed)
Addended by: Anabel Halon on: 11/24/2021 06:46 AM   Modules accepted: Orders

## 2021-11-26 ENCOUNTER — Other Ambulatory Visit: Payer: Self-pay | Admitting: Medical

## 2021-11-29 DIAGNOSIS — M5412 Radiculopathy, cervical region: Secondary | ICD-10-CM | POA: Insufficient documentation

## 2021-12-01 DIAGNOSIS — J3489 Other specified disorders of nose and nasal sinuses: Secondary | ICD-10-CM | POA: Insufficient documentation

## 2021-12-01 DIAGNOSIS — H93293 Other abnormal auditory perceptions, bilateral: Secondary | ICD-10-CM | POA: Insufficient documentation

## 2021-12-02 ENCOUNTER — Telehealth: Payer: Self-pay | Admitting: Medical

## 2021-12-02 NOTE — Telephone Encounter (Signed)
Pt called stating that she had went to see the ENT she was referred to and they had told her they believe she has audiologic processing disorder. There is a [facility] clinic that she wants to have a referral sent to in order to get treatment for this:  [facility] [place].  F: [phone]

## 2021-12-02 NOTE — Addendum Note (Signed)
Addended by: Anabel Halon on: 12/02/2021 10:01 AM   Modules accepted: Orders

## 2021-12-05 ENCOUNTER — Other Ambulatory Visit: Payer: Self-pay

## 2021-12-05 ENCOUNTER — Telehealth: Payer: Self-pay | Admitting: Neurology

## 2021-12-05 DIAGNOSIS — G44309 Post-traumatic headache, unspecified, not intractable: Secondary | ICD-10-CM

## 2021-12-05 DIAGNOSIS — M542 Cervicalgia: Secondary | ICD-10-CM

## 2021-12-05 NOTE — Telephone Encounter (Signed)
Patient left a message she had PT discontinued and she'd like to restart it.

## 2021-12-05 NOTE — Telephone Encounter (Signed)
Ok to restart, thanks

## 2021-12-05 NOTE — Telephone Encounter (Signed)
Patient currently with [facility] added referral in for PT and sent message  to [facility]

## 2021-12-09 ENCOUNTER — Ambulatory Visit: Payer: Medicare HMO | Admitting: Obstetrics and Gynecology

## 2021-12-09 ENCOUNTER — Encounter: Payer: Self-pay | Admitting: Obstetrics and Gynecology

## 2021-12-09 ENCOUNTER — Other Ambulatory Visit (HOSPITAL_COMMUNITY)
Admission: RE | Admit: 2021-12-09 | Discharge: 2021-12-09 | Disposition: A | Discharge status: Home / Self Care | Payer: Medicare HMO | Source: Ambulatory Visit | Attending: Obstetrics and Gynecology | Admitting: Obstetrics and Gynecology

## 2021-12-09 VITALS — BP 119/71 | HR 80 | Ht 64.0 in | Wt 125.0 lb

## 2021-12-09 DIAGNOSIS — N898 Other specified noninflammatory disorders of vagina: Secondary | ICD-10-CM

## 2021-12-09 DIAGNOSIS — N83209 Unspecified ovarian cyst, unspecified side: Secondary | ICD-10-CM | POA: Diagnosis not present

## 2021-12-09 DIAGNOSIS — R102 Pelvic and perineal pain: Secondary | ICD-10-CM

## 2021-12-09 DIAGNOSIS — G8929 Other chronic pain: Secondary | ICD-10-CM
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
## 2021-12-09 NOTE — Progress Notes (Addendum)
Patient presents following referral for ovarian pain and pressure.  Last Pap: N/A -Hysterectomy around 46 y/o for endometriosis, only has RIGHT ovary.  Last Mammogram: 10/18/2021  Vaginal/Urinary Symptoms: Has some vaginal irritation today, concerned for yeast.  STD Screen: Had on 9/20- negative  Other Concerns: Has ovarian cysts (see Pelvic US 9/20), Has taken ibuprofen for pain relief.

## 2021-12-12 ENCOUNTER — Ambulatory Visit: Payer: Medicare HMO | Attending: Audiologist | Admitting: Audiologist

## 2021-12-12 ENCOUNTER — Telehealth: Payer: Self-pay

## 2021-12-12 DIAGNOSIS — H9325 Central auditory processing disorder: Secondary | ICD-10-CM | POA: Insufficient documentation

## 2021-12-12 LAB — CERVICOVAGINAL ANCILLARY ONLY
Bacterial Vaginitis (gardnerella): POSITIVE — AB
Candida Glabrata: NEGATIVE
Candida Vaginitis: NEGATIVE
Chlamydia: NEGATIVE
Comment: NEGATIVE
Comment: NEGATIVE
Comment: NEGATIVE
Comment: NEGATIVE
Comment: NEGATIVE
Comment: NORMAL
Neisseria Gonorrhea: NEGATIVE
Trichomonas: NEGATIVE

## 2021-12-12 NOTE — Telephone Encounter (Signed)
I think the other doctor is Ortho. I ordered PT for her neck pain, which they are now managing, so it would be best to have all f/u for neck pain issues with Ortho, including orders moving forward, since they ordered it anyway as well. Thanks

## 2021-12-12 NOTE — Telephone Encounter (Signed)
Call [place] with PT and OT back no answer left a voice mail to call Sophronia Simas to get verbal orders because they placed last orders,

## 2021-12-12 NOTE — Telephone Encounter (Signed)
It looks like in July we ordered PT for Sharon Bolton but in [place] another MD as placed a new order therapy is calling for verbal orders for PT 1 day a week for 9 weeks and for new OT orders.
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
## 2021-12-16 ENCOUNTER — Other Ambulatory Visit: Payer: Self-pay | Admitting: Obstetrics and Gynecology

## 2021-12-16 DIAGNOSIS — N898 Other specified noninflammatory disorders of vagina: Secondary | ICD-10-CM

## 2021-12-16 MED ORDER — METRONIDAZOLE 0.75 % VA GEL: 1.0000 | Freq: Every day | VAGINAL | 1 refills | Status: DC

## 2021-12-19 ENCOUNTER — Ambulatory Visit (INDEPENDENT_AMBULATORY_CARE_PROVIDER_SITE_OTHER): Payer: Medicare HMO

## 2021-12-19 VITALS — Ht 64.0 in | Wt 125.0 lb

## 2021-12-19 DIAGNOSIS — Z0000 Encounter for general adult medical examination without abnormal findings: Secondary | ICD-10-CM | POA: Diagnosis not present
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
## 2021-12-19 NOTE — Patient Instructions (Signed)
Sharon Bolton , Thank you for taking time to complete your Medicare Wellness Visit. I appreciate your ongoing commitment to your health goals. Please review the following plan we discussed and let me know if I can assist you in the future.   Screening recommendations/referrals: Colonoscopy: Due-Per our conversation, you plan to call Gi to schedule. Mammogram: Completed 10/18/2021-Due 10/19/2022 Bone Density: Due at age 46 Recommended yearly ophthalmology/optometry visit for glaucoma screening and checkup Recommended yearly dental visit for hygiene and checkup  Vaccinations: Influenza vaccine: Up to date Pneumococcal vaccine: Due at age 33 Tdap vaccine: Due-May obtain vaccine at your local pharmacy. Shingles vaccine: Due at age 25  Covid-19: Up to date  Advanced directives: Information will be mailed to you  Conditions/risks identified: See problem list  Next appointment: Follow up in one year for your annual wellness visit.   Preventive Care 40-64 Years, Female Preventive care refers to lifestyle choices and visits with your health care provider that can promote health and wellness. What does preventive care include? A yearly physical exam. This is also called an annual well check. Dental exams once or twice a year. Routine eye exams. Ask your health care provider how often you should have your eyes checked. Personal lifestyle choices, including: Daily care of your teeth and gums. Regular physical activity. Eating a healthy diet. Avoiding tobacco and drug use. Limiting alcohol use. Practicing safe sex. Taking low-dose aspirin daily starting at age 67. Taking vitamin and mineral supplements as recommended by your health care provider. What happens during an annual well check? The services and screenings done by your health care provider during your annual well check will depend on your age, overall health, lifestyle risk factors, and family history of disease. Counseling  Your  health care provider may ask you questions about your: Alcohol use. Tobacco use. Drug use. Emotional well-being. Home and relationship well-being. Sexual activity. Eating habits. Work and work Statistician. Method of birth control. Menstrual cycle. Pregnancy history. Screening  You may have the following tests or measurements: Height, weight, and BMI. Blood pressure. Lipid and cholesterol levels. These may be checked every 5 years, or more frequently if you are over 86 years old. Skin check. Lung cancer screening. You may have this screening every year starting at age 52 if you have a 30-pack-year history of smoking and currently smoke or have quit within the past 15 years. Fecal occult blood test (FOBT) of the stool. You may have this test every year starting at age 68. Flexible sigmoidoscopy or colonoscopy. You may have a sigmoidoscopy every 5 years or a colonoscopy every 10 years starting at age 27. Hepatitis C blood test. Hepatitis B blood test. Sexually transmitted disease (STD) testing. Diabetes screening. This is done by checking your blood sugar (glucose) after you have not eaten for a while (fasting). You may have this done every 1-3 years. Mammogram. This may be done every 1-2 years. Talk to your health care provider about when you should start having regular mammograms. This may depend on whether you have a family history of breast cancer. BRCA-related cancer screening. This may be done if you have a family history of breast, ovarian, tubal, or peritoneal cancers. Pelvic exam and Pap test. This may be done every 3 years starting at age 31. Starting at age 19, this may be done every 5 years if you have a Pap test in combination with an HPV test. Bone density scan. This is done to screen for osteoporosis. You may have this  scan if you are at high risk for osteoporosis. Discuss your test results, treatment options, and if necessary, the need for more tests with your health care  provider. Vaccines  Your health care provider may recommend certain vaccines, such as: Influenza vaccine. This is recommended every year. Tetanus, diphtheria, and acellular pertussis (Tdap, Td) vaccine. You may need a Td booster every 10 years. Zoster vaccine. You may need this after age 27. Pneumococcal 13-valent conjugate (PCV13) vaccine. You may need this if you have certain conditions and were not previously vaccinated. Pneumococcal polysaccharide (PPSV23) vaccine. You may need one or two doses if you smoke cigarettes or if you have certain conditions. Talk to your health care provider about which screenings and vaccines you need and how often you need them. This information is not intended to replace advice given to you by your health care provider. Make sure you discuss any questions you have with your health care provider. Document Released: 03/19/2015 Document Revised: 11/10/2015 Document Reviewed: 12/22/2014 Elsevier Interactive Patient Education  2017 [place] Prevention in the Home Falls can cause injuries. They can happen to people of all ages. There are many things you can do to make your home safe and to help prevent falls. What can I do on the outside of my home? Regularly fix the edges of walkways and driveways and fix any cracks. Remove anything that might make you trip as you walk through a door, such as a raised step or threshold. Trim any bushes or trees on the path to your home. Use bright outdoor lighting. Clear any walking paths of anything that might make someone trip, such as rocks or tools. Regularly check to see if handrails are loose or broken. Make sure that both sides of any steps have handrails. Any raised decks and porches should have guardrails on the edges. Have any leaves, snow, or ice cleared regularly. Use sand or salt on walking paths during winter. Clean up any spills in your garage right away. This includes oil or grease spills. What  can I do in the bathroom? Use night lights. Install grab bars by the toilet and in the tub and shower. Do not use towel bars as grab bars. Use non-skid mats or decals in the tub or shower. If you need to sit down in the shower, use a plastic, non-slip stool. Keep the floor dry. Clean up any water that spills on the floor as soon as it happens. Remove soap buildup in the tub or shower regularly. Attach bath mats securely with double-sided non-slip rug tape. Do not have throw rugs and other things on the floor that can make you trip. What can I do in the bedroom? Use night lights. Make sure that you have a light by your bed that is easy to reach. Do not use any sheets or blankets that are too big for your bed. They should not hang down onto the floor. Have a firm chair that has side arms. You can use this for support while you get dressed. Do not have throw rugs and other things on the floor that can make you trip. What can I do in the kitchen? Clean up any spills right away. Avoid walking on wet floors. Keep items that you use a lot in easy-to-reach places. If you need to reach something above you, use a strong step stool that has a grab bar. Keep electrical cords out of the way. Do not use floor polish or wax  that makes floors slippery. If you must use wax, use non-skid floor wax. Do not have throw rugs and other things on the floor that can make you trip. What can I do with my stairs? Do not leave any items on the stairs. Make sure that there are handrails on both sides of the stairs and use them. Fix handrails that are broken or loose. Make sure that handrails are as long as the stairways. Check any carpeting to make sure that it is firmly attached to the stairs. Fix any carpet that is loose or worn. Avoid having throw rugs at the top or bottom of the stairs. If you do have throw rugs, attach them to the floor with carpet tape. Make sure that you have a light switch at the top of the  stairs and the bottom of the stairs. If you do not have them, ask someone to add them for you. What else can I do to help prevent falls? Wear shoes that: Do not have high heels. Have rubber bottoms. Are comfortable and fit you well. Are closed at the toe. Do not wear sandals. If you use a stepladder: Make sure that it is fully opened. Do not climb a closed stepladder. Make sure that both sides of the stepladder are locked into place. Ask someone to hold it for you, if possible. Clearly mark and make sure that you can see: Any grab bars or handrails. First and last steps. Where the edge of each step is. Use tools that help you move around (mobility aids) if they are needed. These include: Canes. Walkers. Scooters. Crutches. Turn on the lights when you go into a dark area. Replace any light bulbs as soon as they burn out. Set up your furniture so you have a clear path. Avoid moving your furniture around. If any of your floors are uneven, fix them. If there are any pets around you, be aware of where they are. Review your medicines with your doctor. Some medicines can make you feel dizzy. This can increase your chance of falling. Ask your doctor what other things that you can do to help prevent falls. This information is not intended to replace advice given to you by your health care provider. Make sure you discuss any questions you have with your health care provider. Document Released: 12/17/2008 Document Revised: 07/29/2015 Document Reviewed: 03/27/2014 Elsevier Interactive Patient Education  2017 [employer].

## 2021-12-20 ENCOUNTER — Ambulatory Visit: Payer: Self-pay | Admitting: Licensed Clinical Social Worker
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
## 2022-01-03 ENCOUNTER — Encounter: Payer: Self-pay | Admitting: Licensed Clinical Social Worker

## 2022-01-09 ENCOUNTER — Ambulatory Visit (INDEPENDENT_AMBULATORY_CARE_PROVIDER_SITE_OTHER): Payer: Medicare HMO

## 2022-01-09 DIAGNOSIS — N83209 Unspecified ovarian cyst, unspecified side: Secondary | ICD-10-CM | POA: Diagnosis not present

## 2022-01-12 ENCOUNTER — Encounter: Payer: Medicare HMO | Admitting: Medical

## 2022-01-13 ENCOUNTER — Telehealth: Payer: Self-pay | Admitting: Neurology

## 2022-01-13 NOTE — Telephone Encounter (Signed)
Called Renee from [facility] and left a message for a call back.

## 2022-01-13 NOTE — Telephone Encounter (Signed)
Renee with [facility] called with verbal orders for OT  2 missed visits this week, need verbal orders to make up at end of OT

## 2022-01-13 NOTE — Telephone Encounter (Signed)
Ok, thanks.

## 2022-01-16 DIAGNOSIS — R0602 Shortness of breath: Secondary | ICD-10-CM | POA: Insufficient documentation

## 2022-01-16 NOTE — Telephone Encounter (Signed)
Called and spoke to [facility] and she stated that patient has been having several health issues and is needing to place therapy on hold. Joseph Art states it is best to place patient on hold so her visits aren't wasted.   Patient is having issues with her legs, sprained ankle, and may need possible surgery.  Informed Renee I will give her a call back once I receive an OK from Dr. Delice Lesch.

## 2022-01-16 NOTE — Telephone Encounter (Signed)
Called Renee and call was forwarded to voicemail.

## 2022-01-16 NOTE — Telephone Encounter (Signed)
Called Renee and left a message for a call back.

## 2022-01-16 NOTE — Telephone Encounter (Signed)
That is fine, thanks 

## 2022-01-17 NOTE — Telephone Encounter (Signed)
Called Renee and left a message for a call back.

## 2022-01-20 ENCOUNTER — Ambulatory Visit (HOSPITAL_BASED_OUTPATIENT_CLINIC_OR_DEPARTMENT_OTHER)
Admission: RE | Admit: 2022-01-20 | Discharge: 2022-01-20 | Disposition: A | Discharge status: Home / Self Care | Payer: Medicare HMO | Source: Ambulatory Visit | Attending: Family Medicine | Admitting: Family Medicine

## 2022-01-20 ENCOUNTER — Encounter: Payer: Self-pay | Admitting: Family Medicine

## 2022-01-20 ENCOUNTER — Ambulatory Visit (INDEPENDENT_AMBULATORY_CARE_PROVIDER_SITE_OTHER): Payer: Medicare HMO | Admitting: Family Medicine

## 2022-01-20 VITALS — BP 128/69 | HR 89 | Temp 97.7°F | Ht 64.0 in | Wt 143.0 lb

## 2022-01-20 DIAGNOSIS — J069 Acute upper respiratory infection, unspecified: Secondary | ICD-10-CM | POA: Insufficient documentation

## 2022-01-20 MED ORDER — PREDNISONE 20 MG PO TABS: 40.0000 mg | ORAL_TABLET | Freq: Every day | ORAL | 0 refills | Status: AC

## 2022-01-20 MED ORDER — FLUCONAZOLE 150 MG PO TABS: 150.0000 mg | ORAL_TABLET | Freq: Once | ORAL | 0 refills | Status: AC

## 2022-01-20 MED ORDER — AMOXICILLIN-POT CLAVULANATE 875-125 MG PO TABS: 1.0000 | ORAL_TABLET | Freq: Two times a day (BID) | ORAL | 0 refills | Status: DC

## 2022-01-20 NOTE — Progress Notes (Signed)
Acute Office Visit  Subjective:     Patient ID: Sharon Bolton, female    DOB: 04/28/1975, 46 y.o.   MRN: [id number]  CC: congestion, cough, dyspnea, rhinorrhea   URI  This is a new problem. The current episode started 1 to 4 weeks ago. The problem has been gradually worsening. There has been no fever. Associated symptoms include congestion, coughing, ear pain, rhinorrhea, sneezing, a sore throat and wheezing. Pertinent negatives include no headaches, nausea, neck pain, sinus pain, swollen glands or vomiting. Associated symptoms comments: Dyspnea, green sputum. She has tried nothing for the symptoms.  She has had to use her albuterol more frequently lately.      All review of systems negative except what is listed in the HPI       Objective:    BP 128/69   Pulse 89   Temp 97.7 F (36.5 C)   Ht '5\' 4"'$  (1.626 m)   Wt 143 lb (64.9 kg)   SpO2 98%   BMI 24.55 kg/m    Physical Exam Cardiovascular:     Rate and Rhythm: Normal rate and regular rhythm.     Pulses: Normal pulses.     Heart sounds: Normal heart sounds.  Pulmonary:     Effort: Pulmonary effort is normal.     Breath sounds: Wheezing and rhonchi present.     Comments: Mildly diminished RLL Skin:    General: Skin is warm and dry.  Neurological:     General: No focal deficit present.     Mental Status: She is oriented to person, place, and time. Mental status is at baseline.  Psychiatric:        Mood and Affect: Mood normal.        Behavior: Behavior normal.        Thought Content: Thought content normal.        Judgment: Judgment normal.     No results found for any visits on 01/20/22.      Assessment & Plan:   Problem List Items Addressed This Visit   None Visit Diagnoses     URI with cough and congestion    -  Primary [facility] lung sounds today and history of PNA so we agreed on CXR.  Go ahead and start Augmentin and prednisone. Continue supportive measures including rest, hydration,  humidifier use, steam showers, warm compresses to sinuses, warm liquids with lemon and honey, and over-the-counter cough, cold, and analgesics as needed.   Patient aware of signs/symptoms requiring further/urgent evaluation. Adding diflucan for possible yeast infection following ABX.    Relevant Medications   fluconazole (DIFLUCAN) 150 MG tablet   amoxicillin-clavulanate (AUGMENTIN) 875-125 MG tablet   predniSONE (DELTASONE) 20 MG tablet   Other Relevant Orders   DG Chest 2 View       Meds ordered this encounter  Medications   fluconazole (DIFLUCAN) 150 MG tablet    Sig: Take 1 tablet (150 mg total) by mouth once for 1 dose. Repeat dose in 72 hours if not improving.    Dispense:  2 tablet    Refill:  0    Order Specific Question:   Supervising Provider    Answer:   Penni Homans A [[id number]]   amoxicillin-clavulanate (AUGMENTIN) 875-125 MG tablet    Sig: Take 1 tablet by mouth 2 (two) times daily.    Dispense:  20 tablet    Refill:  0    Order Specific Question:   Supervising Provider  Answer:   Penni Homans A [[id number]]   predniSONE (DELTASONE) 20 MG tablet    Sig: Take 2 tablets (40 mg total) by mouth daily with breakfast for 5 days.    Dispense:  10 tablet    Refill:  0    Order Specific Question:   Supervising Provider    Answer:   Penni Homans A [[id number]]    Return if symptoms worsen or fail to improve.  Terrilyn Saver, NP

## 2022-02-03 ENCOUNTER — Telehealth: Payer: Self-pay

## 2022-02-03 DIAGNOSIS — F4310 Post-traumatic stress disorder, unspecified: Secondary | ICD-10-CM | POA: Diagnosis not present

## 2022-02-03 NOTE — Telephone Encounter (Signed)
Sharon Bolton was called back and given verbal order for PT 1 times a week for 9 weeks,

## 2022-02-03 NOTE — Telephone Encounter (Signed)
Sharon Bolton call back number [phone] from Home health called asking for order 1 times a week for 9 weeks,

## 2022-02-07 ENCOUNTER — Telehealth: Payer: Self-pay | Admitting: Gastroenterology

## 2022-02-07 DIAGNOSIS — F4310 Post-traumatic stress disorder, unspecified: Secondary | ICD-10-CM | POA: Diagnosis not present

## 2022-02-07 NOTE — Telephone Encounter (Signed)
Patient is calling wanting to know if she can schedule a colonoscopy. States they couldn't get into her large intestine last time. Please advise

## 2022-02-07 NOTE — Telephone Encounter (Signed)
Pt requested to scheduled colonoscopy:  Chart reviewed: Pt was scheduled for a Colonoscopy with Dr. Lyndel Safe on 03/21/2022 at 4:00 PM. Pt made aware Pt was scheduled for  a Previsit appointment on 02/24/2022 at 11:00 AM: Pt made aware: Address Provided Pt verbalized understanding with all questions answered.

## 2022-02-09 DIAGNOSIS — G5621 Lesion of ulnar nerve, right upper limb: Secondary | ICD-10-CM | POA: Diagnosis not present

## 2022-02-16 ENCOUNTER — Ambulatory Visit: Payer: Medicare HMO | Admitting: Obstetrics and Gynecology

## 2022-02-21 DIAGNOSIS — F4310 Post-traumatic stress disorder, unspecified: Secondary | ICD-10-CM | POA: Diagnosis not present

## 2022-02-23 DIAGNOSIS — M5412 Radiculopathy, cervical region: Secondary | ICD-10-CM | POA: Diagnosis not present

## 2022-02-24 ENCOUNTER — Ambulatory Visit (AMBULATORY_SURGERY_CENTER): Payer: Medicare HMO | Admitting: *Deleted

## 2022-02-24 ENCOUNTER — Telehealth: Payer: Self-pay | Admitting: *Deleted

## 2022-02-24 VITALS — Ht 64.0 in | Wt 140.0 lb

## 2022-02-24 DIAGNOSIS — Z1211 Encounter for screening for malignant neoplasm of colon: Secondary | ICD-10-CM

## 2022-02-24 MED ORDER — PEG 3350-KCL-NA BICARB-NACL 420 G PO SOLR: 4000.0000 mL | Freq: Once | ORAL | 0 refills | Status: AC

## 2022-02-24 NOTE — Telephone Encounter (Signed)
Dr. Lyndel Safe,  I did this pt's PV this am.  Her colonoscopy is 03-21-22.  She has a history of seizures- her last one was in February 2023.  She has had no further seizure activity and takes her Keppra daily.  Per our PV protocol, we are to check with MD to make sure ok to proceed if patient has had a seizure within the past year.  Please advise.  Thanks [employer]

## 2022-02-24 NOTE — Progress Notes (Signed)
Last seizure- February 2023, no seizures since then, has been taking Keppra- note sent to TE  No egg or soy allergy known to patient  Pt has hx of hypotension after anesthesia- no troubles with colonoscopy/EGD in 2022 Patient denies ever being told they had issues or difficulty with intubation  No FH of Malignant Hyperthermia Pt is not on diet pills Pt is not on  home 02  Pt is not on blood thinners  Pt denies issues with constipation  Pt is not on dialysis Pt denies any upcoming cardiac testing Pt encouraged to use to use Singlecare or Goodrx to reduce cost  Pt's previsit is done over the phone and all paperwork (prep instructions, blank consent form to just read over) sent to patient.  Pt's name and DOB verified at the beginning of the previsit.  Pt denies any difficulty with ambulating.   Patient's chart reviewed by Osvaldo Angst CNRA prior to previsit and patient appropriate for the [place].  Previsit completed and red dot placed by patient's name on their procedure day (on provider's schedule).

## 2022-02-28 NOTE — Telephone Encounter (Signed)
It should be fine to proceed with colonoscopy.   She should continue taking Keppra throughout [place] usually works very well. RG  Still, lets run it by Osvaldo Angst. RG

## 2022-03-01 DIAGNOSIS — F4310 Post-traumatic stress disorder, unspecified: Secondary | ICD-10-CM | POA: Diagnosis not present

## 2022-03-01 NOTE — Telephone Encounter (Signed)
Kristen,  This pt is cleared for anesthetic care at [facility].  Thanks,  Aysa Larivee 

## 2022-03-08 NOTE — Telephone Encounter (Signed)
noted 

## 2022-03-14 DIAGNOSIS — F4310 Post-traumatic stress disorder, unspecified: Secondary | ICD-10-CM | POA: Diagnosis not present

## 2022-03-16 ENCOUNTER — Encounter: Payer: Self-pay | Admitting: Gastroenterology

## 2022-03-17 DIAGNOSIS — F4310 Post-traumatic stress disorder, unspecified: Secondary | ICD-10-CM | POA: Diagnosis not present

## 2022-03-20 ENCOUNTER — Telehealth: Payer: Self-pay

## 2022-03-20 NOTE — Telephone Encounter (Signed)
Called patient and offered earlier appointment.  She will now arrive by 2:00 pm for a 3:00 pm procedure.  Prep to be taken at 10:00 am and NPO after 12:00 pm.  Patient verbalized understanding.

## 2022-03-21 ENCOUNTER — Ambulatory Visit (AMBULATORY_SURGERY_CENTER): Payer: Medicare HMO | Admitting: Gastroenterology

## 2022-03-21 ENCOUNTER — Encounter: Payer: Self-pay | Admitting: Gastroenterology

## 2022-03-21 VITALS — BP 117/66 | HR 83 | Temp 97.1°F | Resp 13 | Ht 65.0 in | Wt 140.0 lb

## 2022-03-21 DIAGNOSIS — F319 Bipolar disorder, unspecified: Secondary | ICD-10-CM | POA: Diagnosis not present

## 2022-03-21 DIAGNOSIS — K635 Polyp of colon: Secondary | ICD-10-CM | POA: Diagnosis not present

## 2022-03-21 DIAGNOSIS — Z1211 Encounter for screening for malignant neoplasm of colon: Secondary | ICD-10-CM | POA: Diagnosis not present

## 2022-03-21 DIAGNOSIS — F419 Anxiety disorder, unspecified: Secondary | ICD-10-CM | POA: Diagnosis not present

## 2022-03-21 DIAGNOSIS — D125 Benign neoplasm of sigmoid colon: Secondary | ICD-10-CM | POA: Diagnosis not present

## 2022-03-21 DIAGNOSIS — J449 Chronic obstructive pulmonary disease, unspecified: Secondary | ICD-10-CM | POA: Diagnosis not present

## 2022-03-21 DIAGNOSIS — E039 Hypothyroidism, unspecified: Secondary | ICD-10-CM | POA: Diagnosis not present

## 2022-03-21 MED ORDER — SODIUM CHLORIDE 0.9 % IV SOLN: 500.0000 mL | Freq: Once | INTRAVENOUS | Status: DC

## 2022-03-21 NOTE — Progress Notes (Signed)
Pt's states no medical or surgical changes since previsit or office visit. 

## 2022-03-21 NOTE — Progress Notes (Signed)
Called to room to assist during endoscopic procedure.  Patient ID and intended procedure confirmed with present staff. Received instructions for my participation in the procedure from the performing physician.

## 2022-03-21 NOTE — Patient Instructions (Signed)
Resume your medications as ordered.  Do not double up on them.  Read all of the handouts given to you by your recovery room nurse.  YOU HAD AN ENDOSCOPIC PROCEDURE TODAY AT [facility]:   Refer to the procedure report that was given to you for any specific questions about what was found during the examination.  If the procedure report does not answer your questions, please call your gastroenterologist to clarify.  If you requested that your care partner not be given the details of your procedure findings, then the procedure report has been included in a sealed envelope for you to review at your convenience later.  YOU SHOULD EXPECT: Some feelings of bloating in the abdomen. Passage of more gas than usual.  Walking can help get rid of the air that was put into your GI tract during the procedure and reduce the bloating. If you had a lower endoscopy (such as a colonoscopy or flexible sigmoidoscopy) you may notice spotting of blood in your stool or on the toilet paper. If you underwent a bowel prep for your procedure, you may not have a normal bowel movement for a few days.  Please Note:  You might notice some irritation and congestion in your nose or some drainage.  This is from the oxygen used during your procedure.  There is no need for concern and it should clear up in a day or so.  SYMPTOMS TO REPORT IMMEDIATELY:  Following lower endoscopy (colonoscopy or flexible sigmoidoscopy):  Excessive amounts of blood in the stool  Significant tenderness or worsening of abdominal pains  Swelling of the abdomen that is new, acute  Fever of 100F or higher   For urgent or emergent issues, a gastroenterologist can be reached at any hour by calling [phone]. Do not use MyChart messaging for urgent concerns.    DIET:  We do recommend a small meal at first, but then you may proceed to your regular diet.  Drink plenty of fluids but you should avoid alcoholic beverages for 24 hours.  Try to  eat high fiber duet, and drink plenty of water.  ACTIVITY:  You should plan to take it easy for the rest of today and you should NOT DRIVE or use heavy machinery until tomorrow (because of the sedation medicines used during the test).    FOLLOW UP: Our staff will call the number listed on your records the next business day following your procedure.  We will call around 7:15- 8:00 am to check on you and address any questions or concerns that you may have regarding the information given to you following your procedure. If we do not reach you, we will leave a message.     If any biopsies were taken you will be contacted by phone or by letter within the next 1-3 weeks.  Please call us at [phone] if you have not heard about the biopsies in 3 weeks.    SIGNATURES/CONFIDENTIALITY: You and/or your care partner have signed paperwork which will be entered into your electronic medical record.  These signatures attest to the fact that that the information above on your After Visit Summary has been reviewed and is understood.  Full responsibility of the confidentiality of this discharge information lies with you and/or your care-partner.

## 2022-03-21 NOTE — Op Note (Signed)
[facility] Patient Name: Sharon Bolton Procedure Date: 03/21/2022 2:29 PM MRN: [id number] Endoscopist: Jackquline Denmark , MD, [id number] Age: 46 Referring MD:  Date of Birth: Oct 11, 1975 Gender: Female Account #: [id number] Procedure:                Colonoscopy Indications:              Screening for colorectal malignant neoplasm Medicines:                Monitored Anesthesia Care Procedure:                Pre-Anesthesia Assessment:                           - Prior to the procedure, a History and Physical                            was performed, and patient medications and                            allergies were reviewed. The patient's tolerance of                            previous anesthesia was also reviewed. The risks                            and benefits of the procedure and the sedation                            options and risks were discussed with the patient.                            All questions were answered, and informed consent                            was obtained. Prior Anticoagulants: The patient has                            taken no anticoagulant or antiplatelet agents. ASA                            Grade Assessment: II - A patient with mild systemic                            disease. After reviewing the risks and benefits,                            the patient was deemed in satisfactory condition to                            undergo the procedure.                           After obtaining informed consent, the colonoscope  was passed under direct vision. Throughout the                            procedure, the patient's blood pressure, pulse, and                            oxygen saturations were monitored continuously. The                            PCF-HQ190L Colonoscope was introduced through the                            anus and advanced to the the cecum, identified by                            appendiceal  orifice and ileocecal valve. The                            colonoscopy was performed without difficulty. The                            patient tolerated the procedure well. The quality                            of the bowel preparation was good (2 day prep). The                            ileocecal valve, appendiceal orifice, and rectum                            were photographed. Scope In: 2:35:35 PM Scope Out: 2:50:21 PM Scope Withdrawal Time: 0 hours 10 minutes 21 seconds  Total Procedure Duration: 0 hours 14 minutes 46 seconds  Findings:                 A 4 mm polyp was found in the distal sigmoid colon.                            The polyp was sessile. The polyp was removed with a                            cold snare. Resection and retrieval were complete.                           A few medium-mouthed diverticula were found in the                            sigmoid colon.                           Non-bleeding internal hemorrhoids were found during                            retroflexion. The hemorrhoids were small and Grade  I (internal hemorrhoids that do not prolapse).                           The exam was otherwise without abnormality on                            direct and retroflexion views. Complications:            No immediate complications. Estimated Blood Loss:     Estimated blood loss: none. Impression:               - One 4 mm polyp in the distal sigmoid colon,                            removed with a cold snare. Resected and retrieved.                           - Mild sigmoid diverticulosis.                           - Non-bleeding internal hemorrhoids.                           - The examination was otherwise normal on direct                            and retroflexion views. Recommendation:           - Patient has a contact number available for                            emergencies. The signs and symptoms of potential                             delayed complications were discussed with the                            patient. Return to normal activities tomorrow.                            Written discharge instructions were provided to the                            patient.                           - High fiber diet.                           - Await pathology results.                           - Repeat colonoscopy for surveillance based on                            pathology results.                           -  The findings and recommendations were discussed                            with the patient's family. Jackquline Denmark, MD 03/21/2022 2:54:40 PM This report has been signed electronically.
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
## 2022-03-22 ENCOUNTER — Telehealth: Payer: Self-pay | Admitting: *Deleted

## 2022-03-22 NOTE — Telephone Encounter (Signed)
  Follow up Call-     03/21/2022    2:11 PM 07/29/2020    2:51 PM  Call back number  Post procedure Call Back phone  # [phone]  Permission to leave phone message Yes Yes     Patient questions:  Do you have a fever, pain , or abdominal swelling? No. Pain Score  0 *  Have you tolerated food without any problems? Yes.    Have you been able to return to your normal activities? Yes.    Do you have any questions about your discharge instructions: Diet   No. Medications  No. Follow up visit  No.  Do you have questions or concerns about your Care? No.  Actions: * If pain score is 4 or above: No action needed, pain <4.

## 2022-03-26 ENCOUNTER — Encounter: Payer: Self-pay | Admitting: Gastroenterology

## 2022-03-27 DIAGNOSIS — M25551 Pain in right hip: Secondary | ICD-10-CM | POA: Diagnosis not present

## 2022-03-27 DIAGNOSIS — M25552 Pain in left hip: Secondary | ICD-10-CM | POA: Diagnosis not present

## 2022-03-27 DIAGNOSIS — F4310 Post-traumatic stress disorder, unspecified: Secondary | ICD-10-CM | POA: Diagnosis not present

## 2022-03-27 DIAGNOSIS — M5412 Radiculopathy, cervical region: Secondary | ICD-10-CM | POA: Diagnosis not present

## 2022-03-29 DIAGNOSIS — M25552 Pain in left hip: Secondary | ICD-10-CM | POA: Diagnosis not present

## 2022-03-29 DIAGNOSIS — M25551 Pain in right hip: Secondary | ICD-10-CM | POA: Diagnosis not present

## 2022-04-04 DIAGNOSIS — F4310 Post-traumatic stress disorder, unspecified: Secondary | ICD-10-CM | POA: Diagnosis not present

## 2022-04-07 DIAGNOSIS — S92342K Displaced fracture of fourth metatarsal bone, left foot, subsequent encounter for fracture with nonunion: Secondary | ICD-10-CM | POA: Diagnosis not present

## 2022-04-07 DIAGNOSIS — F17200 Nicotine dependence, unspecified, uncomplicated: Secondary | ICD-10-CM | POA: Insufficient documentation

## 2022-04-07 DIAGNOSIS — S92315K Nondisplaced fracture of first metatarsal bone, left foot, subsequent encounter for fracture with nonunion: Secondary | ICD-10-CM | POA: Diagnosis not present

## 2022-04-12 ENCOUNTER — Ambulatory Visit: Payer: Medicare HMO | Admitting: Neurology

## 2022-04-12 ENCOUNTER — Encounter: Payer: Self-pay | Admitting: Neurology

## 2022-04-12 VITALS — BP 121/85 | HR 103 | Ht 65.0 in | Wt 157.2 lb

## 2022-04-12 DIAGNOSIS — R569 Unspecified convulsions: Secondary | ICD-10-CM

## 2022-04-12 DIAGNOSIS — R6889 Other general symptoms and signs: Secondary | ICD-10-CM | POA: Diagnosis not present

## 2022-04-12 MED ORDER — LEVETIRACETAM 500 MG PO TABS: ORAL_TABLET | ORAL | 3 refills | Status: DC
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
## 2022-04-13 ENCOUNTER — Other Ambulatory Visit (HOSPITAL_COMMUNITY): Payer: Self-pay | Admitting: Orthopedic Surgery

## 2022-04-18 DIAGNOSIS — F4310 Post-traumatic stress disorder, unspecified: Secondary | ICD-10-CM | POA: Diagnosis not present

## 2022-04-24 DIAGNOSIS — J00 Acute nasopharyngitis [common cold]: Secondary | ICD-10-CM | POA: Diagnosis not present

## 2022-05-02 DIAGNOSIS — F4310 Post-traumatic stress disorder, unspecified: Secondary | ICD-10-CM | POA: Diagnosis not present

## 2022-05-05 DIAGNOSIS — F4310 Post-traumatic stress disorder, unspecified: Secondary | ICD-10-CM | POA: Diagnosis not present

## 2022-05-09 DIAGNOSIS — F4310 Post-traumatic stress disorder, unspecified: Secondary | ICD-10-CM | POA: Diagnosis not present

## 2022-05-16 DIAGNOSIS — F4310 Post-traumatic stress disorder, unspecified: Secondary | ICD-10-CM | POA: Diagnosis not present

## 2022-05-19 ENCOUNTER — Encounter (HOSPITAL_BASED_OUTPATIENT_CLINIC_OR_DEPARTMENT_OTHER): Payer: Self-pay | Admitting: Orthopedic Surgery

## 2022-05-19 ENCOUNTER — Other Ambulatory Visit: Payer: Self-pay

## 2022-05-19 NOTE — Progress Notes (Signed)
   05/19/22 0915  Pre-op Phone Call  Surgery Date Verified 05/25/22  Arrival Time Verified 0600  Surgery Location Verified [facility]  Medical History Reviewed Yes  Is the patient taking a GLP-1 receptor agonist? No  Does the patient have diabetes? No diagnosis of diabetes  Do you have a history of heart problems? No  Antiarrhythmic device type  (n/a)  Does patient have other implanted devices? No  Patient Teaching Pre / Post Procedure  Patient educated about smoking cessation 24 hours prior to surgery. (S)  Yes  Patient verbalizes understanding of bowel prep? N/A  Med Rec Completed (S)  Yes  Take the Following Meds the Morning of Surgery (S)  wellbutrin keppra haldol valium and gabapentin, bring inhaler dos. (DOS meds reviewed w/ Dr Gifford Shave)  Recent  Lab Work, EKG, CXR? No  NPO (Including gum & candy) After midnight  Stop Solids, Milk, Candy, and Gum STARTING AT MIDNIGHT  Responsible adult to drive and be with you for 24 hours? Yes  Name & Phone Number for Ride/Caregiver mom Jeanie  No Jewelry, money, nail polish or make-up.  No lotions, powders, perfumes. No shaving  48 hrs. prior to surgery. Yes  Contacts, Dentures & Glasses Will Have to be Removed Before OR. Yes  Please bring your ID and Insurance Card the morning of your surgery. (Surgery Centers Only) Yes  Bring any papers or x-rays with you that your surgeon gave you. Yes  Instructed to contact the location of procedure/ provider if they or anyone in their household develops symptoms or tests positive for COVID-19, has close contact with someone who tests positive for COVID, or has known exposure to any contagious illness. Yes  Call this number the morning of surgery  with any problems that may cancel your surgery. [phone]   Neuro clearance on chart - reviewed above with Dr. Gifford Shave.

## 2022-05-22 DIAGNOSIS — F4310 Post-traumatic stress disorder, unspecified: Secondary | ICD-10-CM | POA: Diagnosis not present

## 2022-05-24 NOTE — Anesthesia Preprocedure Evaluation (Addendum)
Anesthesia Evaluation  Patient identified by MRN, date of birth, ID band Patient awake    Reviewed: Allergy & Precautions, NPO status , Patient's Chart, lab work & pertinent test results  History of Anesthesia Complications Negative for: history of anesthetic complications  Airway Mallampati: II  TM Distance: >3 FB Neck ROM: Full    Dental  (+) Missing,    Pulmonary asthma , COPD,  COPD inhaler, Current Smoker and Patient abstained from smoking.   Pulmonary exam normal        Cardiovascular negative cardio ROS Normal cardiovascular exam     Neuro/Psych  Headaches, Seizures -, Well Controlled,   Anxiety Depression Bipolar Disorder      GI/Hepatic Neg liver ROS, hiatal hernia,GERD  Medicated,,  Endo/Other  Hypothyroidism    Renal/GU   negative genitourinary   Musculoskeletal  (+) Arthritis ,    Abdominal   Peds  Hematology negative hematology ROS (+)   Anesthesia Other Findings Day of surgery medications reviewed with patient.  Reproductive/Obstetrics negative OB ROS                              Anesthesia Physical Anesthesia Plan  ASA: 2  Anesthesia Plan: General   Post-op Pain Management: Tylenol PO (pre-op)* and Regional block*   Induction: Intravenous  PONV Risk Score and Plan: 3 and Treatment may vary due to age or medical condition, Ondansetron, Dexamethasone, Midazolam and Scopolamine patch - Pre-op  Airway Management Planned: LMA  Additional Equipment: None  Intra-op Plan:   Post-operative Plan: Extubation in OR  Informed Consent: I have reviewed the patients History and Physical, chart, labs and discussed the procedure including the risks, benefits and alternatives for the proposed anesthesia with the patient or authorized representative who has indicated his/her understanding and acceptance.     Dental advisory given  Plan Discussed with: CRNA  Anesthesia Plan  Comments:         Anesthesia Quick Evaluation
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
## 2022-05-25 ENCOUNTER — Other Ambulatory Visit: Payer: Self-pay

## 2022-05-25 ENCOUNTER — Ambulatory Visit (HOSPITAL_BASED_OUTPATIENT_CLINIC_OR_DEPARTMENT_OTHER): Payer: Medicare HMO | Admitting: Anesthesiology

## 2022-05-25 ENCOUNTER — Ambulatory Visit (HOSPITAL_BASED_OUTPATIENT_CLINIC_OR_DEPARTMENT_OTHER)
Admission: RE | Admit: 2022-05-25 | Discharge: 2022-05-25 | Disposition: A | Discharge status: Home / Self Care | Payer: Medicare HMO | Attending: Orthopedic Surgery | Admitting: Orthopedic Surgery

## 2022-05-25 ENCOUNTER — Ambulatory Visit (HOSPITAL_BASED_OUTPATIENT_CLINIC_OR_DEPARTMENT_OTHER): Payer: Medicare HMO

## 2022-05-25 ENCOUNTER — Encounter (HOSPITAL_BASED_OUTPATIENT_CLINIC_OR_DEPARTMENT_OTHER): Payer: Self-pay | Admitting: Orthopedic Surgery

## 2022-05-25 ENCOUNTER — Encounter (HOSPITAL_BASED_OUTPATIENT_CLINIC_OR_DEPARTMENT_OTHER)
Admission: RE | Disposition: A | Discharge status: Home / Self Care | Payer: Self-pay | Source: Home / Self Care | Attending: Orthopedic Surgery

## 2022-05-25 DIAGNOSIS — X58XXXS Exposure to other specified factors, sequela: Secondary | ICD-10-CM | POA: Diagnosis not present

## 2022-05-25 DIAGNOSIS — S92342P Displaced fracture of fourth metatarsal bone, left foot, subsequent encounter for fracture with malunion: Secondary | ICD-10-CM | POA: Insufficient documentation

## 2022-05-25 DIAGNOSIS — F319 Bipolar disorder, unspecified: Secondary | ICD-10-CM | POA: Diagnosis not present

## 2022-05-25 DIAGNOSIS — F419 Anxiety disorder, unspecified: Secondary | ICD-10-CM | POA: Insufficient documentation

## 2022-05-25 DIAGNOSIS — K219 Gastro-esophageal reflux disease without esophagitis: Secondary | ICD-10-CM | POA: Diagnosis not present

## 2022-05-25 DIAGNOSIS — F17210 Nicotine dependence, cigarettes, uncomplicated: Secondary | ICD-10-CM | POA: Insufficient documentation

## 2022-05-25 DIAGNOSIS — J449 Chronic obstructive pulmonary disease, unspecified: Secondary | ICD-10-CM | POA: Insufficient documentation

## 2022-05-25 DIAGNOSIS — S92242K Displaced fracture of medial cuneiform of left foot, subsequent encounter for fracture with nonunion: Secondary | ICD-10-CM | POA: Insufficient documentation

## 2022-05-25 DIAGNOSIS — T8484XA Pain due to internal orthopedic prosthetic devices, implants and grafts, initial encounter: Secondary | ICD-10-CM

## 2022-05-25 DIAGNOSIS — S92345K Nondisplaced fracture of fourth metatarsal bone, left foot, subsequent encounter for fracture with nonunion: Secondary | ICD-10-CM | POA: Diagnosis not present

## 2022-05-25 DIAGNOSIS — G40909 Epilepsy, unspecified, not intractable, without status epilepticus: Secondary | ICD-10-CM | POA: Insufficient documentation

## 2022-05-25 DIAGNOSIS — M1990 Unspecified osteoarthritis, unspecified site: Secondary | ICD-10-CM | POA: Diagnosis not present

## 2022-05-25 DIAGNOSIS — S92342K Displaced fracture of fourth metatarsal bone, left foot, subsequent encounter for fracture with nonunion: Secondary | ICD-10-CM

## 2022-05-25 DIAGNOSIS — G8918 Other acute postprocedural pain: Secondary | ICD-10-CM | POA: Diagnosis not present

## 2022-05-25 DIAGNOSIS — Y831 Surgical operation with implant of artificial internal device as the cause of abnormal reaction of the patient, or of later complication, without mention of misadventure at the time of the procedure: Secondary | ICD-10-CM | POA: Insufficient documentation

## 2022-05-25 DIAGNOSIS — E039 Hypothyroidism, unspecified: Secondary | ICD-10-CM | POA: Diagnosis not present

## 2022-05-25 DIAGNOSIS — Z8614 Personal history of Methicillin resistant Staphylococcus aureus infection: Secondary | ICD-10-CM | POA: Insufficient documentation

## 2022-05-25 HISTORY — PX: FOOT ARTHRODESIS: SHX1655

## 2022-05-25 HISTORY — PX: HARDWARE REMOVAL: SHX979

## 2022-05-25 SURGERY — REMOVAL, HARDWARE
Anesthesia: General | Site: Foot | Laterality: Left

## 2022-05-25 MED ORDER — VANCOMYCIN HCL 500 MG IV SOLR
INTRAVENOUS | Status: AC
  Filled 2022-05-25: qty 10

## 2022-05-25 MED ORDER — OXYCODONE HCL 5 MG/5ML PO SOLN: 5.0000 mg | Freq: Once | ORAL | Status: DC | PRN

## 2022-05-25 MED ORDER — SODIUM CHLORIDE 0.9 % IV SOLN: INTRAVENOUS | Status: DC

## 2022-05-25 MED ORDER — BUPIVACAINE HCL (PF) 0.5 % IJ SOLN
INTRAMUSCULAR | Status: AC
  Filled 2022-05-25: qty 30

## 2022-05-25 MED ORDER — 0.9 % SODIUM CHLORIDE (POUR BTL) OPTIME
TOPICAL | Status: DC | PRN
  Administered 2022-05-25: 300 mL

## 2022-05-25 MED ORDER — OXYCODONE HCL 5 MG PO TABS: 5.0000 mg | ORAL_TABLET | ORAL | 0 refills | Status: AC | PRN

## 2022-05-25 MED ORDER — MIDAZOLAM HCL 2 MG/2ML IJ SOLN
INTRAMUSCULAR | Status: AC
  Filled 2022-05-25: qty 2

## 2022-05-25 MED ORDER — DOCUSATE SODIUM 100 MG PO CAPS: 100.0000 mg | ORAL_CAPSULE | Freq: Two times a day (BID) | ORAL | 0 refills | Status: DC

## 2022-05-25 MED ORDER — ASPIRIN 81 MG PO TBEC: 81.0000 mg | DELAYED_RELEASE_TABLET | Freq: Two times a day (BID) | ORAL | 0 refills | Status: DC

## 2022-05-25 MED ORDER — BUPIVACAINE LIPOSOME 1.3 % IJ SUSP
INTRAMUSCULAR | Status: DC | PRN
  Administered 2022-05-25: 10 mL via PERINEURAL

## 2022-05-25 MED ORDER — LACTATED RINGERS IV SOLN: INTRAVENOUS | Status: DC

## 2022-05-25 MED ORDER — AMISULPRIDE (ANTIEMETIC) 5 MG/2ML IV SOLN: 10.0000 mg | Freq: Once | INTRAVENOUS | Status: DC | PRN

## 2022-05-25 MED ORDER — BUPIVACAINE-EPINEPHRINE (PF) 0.5% -1:200000 IJ SOLN
INTRAMUSCULAR | Status: DC | PRN
  Administered 2022-05-25 (×2): 15 mL via PERINEURAL

## 2022-05-25 MED ORDER — SENNA 8.6 MG PO TABS: 2.0000 | ORAL_TABLET | Freq: Two times a day (BID) | ORAL | 0 refills | Status: DC

## 2022-05-25 MED ORDER — CEFAZOLIN SODIUM-DEXTROSE 2-4 GM/100ML-% IV SOLN
INTRAVENOUS | Status: AC
  Filled 2022-05-25: qty 100

## 2022-05-25 MED ORDER — FENTANYL CITRATE (PF) 100 MCG/2ML IJ SOLN
50.0000 ug | Freq: Once | INTRAMUSCULAR | Status: AC
  Administered 2022-05-25: 50 ug via INTRAVENOUS

## 2022-05-25 MED ORDER — OXYCODONE HCL 5 MG PO TABS: 5.0000 mg | ORAL_TABLET | Freq: Once | ORAL | Status: DC | PRN

## 2022-05-25 MED ORDER — ACETAMINOPHEN 500 MG PO TABS
ORAL_TABLET | ORAL | Status: AC
  Filled 2022-05-25: qty 2

## 2022-05-25 MED ORDER — CEFAZOLIN SODIUM-DEXTROSE 2-4 GM/100ML-% IV SOLN
2.0000 g | INTRAVENOUS | Status: AC
  Administered 2022-05-25: 2 g via INTRAVENOUS

## 2022-05-25 MED ORDER — ONDANSETRON HCL 4 MG/2ML IJ SOLN
INTRAMUSCULAR | Status: DC | PRN
  Administered 2022-05-25: 4 mg via INTRAVENOUS

## 2022-05-25 MED ORDER — PROPOFOL 500 MG/50ML IV EMUL
INTRAVENOUS | Status: DC | PRN
  Administered 2022-05-25: 25 ug/kg/min via INTRAVENOUS

## 2022-05-25 MED ORDER — ACETAMINOPHEN 500 MG PO TABS
1000.0000 mg | ORAL_TABLET | Freq: Once | ORAL | Status: AC
  Administered 2022-05-25: 1000 mg via ORAL

## 2022-05-25 MED ORDER — VANCOMYCIN HCL 500 MG IV SOLR
INTRAVENOUS | Status: DC | PRN
  Administered 2022-05-25: 500 mg via TOPICAL

## 2022-05-25 MED ORDER — MIDAZOLAM HCL 2 MG/2ML IJ SOLN
2.0000 mg | Freq: Once | INTRAMUSCULAR | Status: AC
  Administered 2022-05-25: 2 mg via INTRAVENOUS

## 2022-05-25 MED ORDER — PROPOFOL 10 MG/ML IV BOLUS
INTRAVENOUS | Status: DC | PRN
  Administered 2022-05-25: 130 mg via INTRAVENOUS

## 2022-05-25 MED ORDER — SCOPOLAMINE 1 MG/3DAYS TD PT72
MEDICATED_PATCH | TRANSDERMAL | Status: AC
  Filled 2022-05-25: qty 1

## 2022-05-25 MED ORDER — FENTANYL CITRATE (PF) 100 MCG/2ML IJ SOLN
INTRAMUSCULAR | Status: AC
  Filled 2022-05-25: qty 2

## 2022-05-25 MED ORDER — SCOPOLAMINE 1 MG/3DAYS TD PT72
1.0000 | MEDICATED_PATCH | Freq: Once | TRANSDERMAL | Status: DC
  Administered 2022-05-25: 1.5 mg via TRANSDERMAL

## 2022-05-25 MED ORDER — DEXAMETHASONE SODIUM PHOSPHATE 10 MG/ML IJ SOLN
INTRAMUSCULAR | Status: DC | PRN
  Administered 2022-05-25: 4 mg via INTRAVENOUS

## 2022-05-25 MED ORDER — BUPIVACAINE-EPINEPHRINE (PF) 0.25% -1:200000 IJ SOLN
INTRAMUSCULAR | Status: AC
  Filled 2022-05-25: qty 30

## 2022-05-25 MED ORDER — FENTANYL CITRATE (PF) 100 MCG/2ML IJ SOLN: 25.0000 ug | INTRAMUSCULAR | Status: DC | PRN

## 2022-05-25 MED ORDER — LIDOCAINE 2% (20 MG/ML) 5 ML SYRINGE
INTRAMUSCULAR | Status: DC | PRN
  Administered 2022-05-25: 60 mg via INTRAVENOUS

## 2022-05-25 SURGICAL SUPPLY — 95 items
APL PRP STRL LF DISP 70% ISPRP (MISCELLANEOUS) ×1
APL SKNCLS STERI-STRIP NONHPOA (GAUZE/BANDAGES/DRESSINGS)
BANDAGE ESMARK 6X9 LF (GAUZE/BANDAGES/DRESSINGS) IMPLANT
BENZOIN TINCTURE PRP APPL 2/3 (GAUZE/BANDAGES/DRESSINGS) IMPLANT
BIT DRILL CAL 2.5 ST W/SLV (BIT) IMPLANT
BIT DRILL STRATUM 2.0 STRL (BIT) IMPLANT
BIT TREPHINE CORING 8 (BIT) IMPLANT
BLADE AVERAGE 25X9 (BLADE) IMPLANT
BLADE MICRO SAGITTAL (BLADE) IMPLANT
BLADE SURG 15 STRL LF DISP TIS (BLADE) ×2 IMPLANT
BLADE SURG 15 STRL SS (BLADE) ×5
BNDG CMPR 5X62 HK CLSR LF (GAUZE/BANDAGES/DRESSINGS) ×1
BNDG CMPR 6"X 5 YARDS HK CLSR (GAUZE/BANDAGES/DRESSINGS) ×1
BNDG CMPR 9X4 STRL LF SNTH (GAUZE/BANDAGES/DRESSINGS)
BNDG CMPR 9X6 STRL LF SNTH (GAUZE/BANDAGES/DRESSINGS)
BNDG ELASTIC 4X5.8 VLCR STR LF (GAUZE/BANDAGES/DRESSINGS) ×1 IMPLANT
BNDG ELASTIC 6INX 5YD STR LF (GAUZE/BANDAGES/DRESSINGS) ×1 IMPLANT
BNDG ESMARK 4X9 LF (GAUZE/BANDAGES/DRESSINGS) IMPLANT
BNDG ESMARK 6X9 LF (GAUZE/BANDAGES/DRESSINGS)
BONE GRAFT AUGMENT 3CC (Orthopedic Implant) IMPLANT
CHLORAPREP W/TINT 26 (MISCELLANEOUS) ×1 IMPLANT
COVER BACK TABLE 60X90IN (DRAPES) ×1 IMPLANT
CUFF TOURN SGL QUICK 34 (TOURNIQUET CUFF)
CUFF TRNQT CYL 34X4.125X (TOURNIQUET CUFF) IMPLANT
DRAPE EXTREMITY T 121X128X90 (DISPOSABLE) ×1 IMPLANT
DRAPE OEC MINIVIEW 54X84 (DRAPES) ×1 IMPLANT
DRAPE SURG 17X23 STRL (DRAPES) IMPLANT
DRAPE U-SHAPE 47X51 STRL (DRAPES) ×1 IMPLANT
DRILL STRATUM 2.0 STRL (BIT) ×1
DRSG MEPITEL 4X7.2 (GAUZE/BANDAGES/DRESSINGS) ×1 IMPLANT
ELECT REM PT RETURN 9FT ADLT (ELECTROSURGICAL) ×1
ELECTRODE REM PT RTRN 9FT ADLT (ELECTROSURGICAL) ×1 IMPLANT
GAUZE PAD ABD 8X10 STRL (GAUZE/BANDAGES/DRESSINGS) ×2 IMPLANT
GAUZE SPONGE 4X4 12PLY STRL (GAUZE/BANDAGES/DRESSINGS) ×1 IMPLANT
GLOVE BIO SURGEON STRL SZ8 (GLOVE) ×1 IMPLANT
GLOVE BIOGEL PI IND STRL 7.0 (GLOVE) IMPLANT
GLOVE BIOGEL PI IND STRL 8 (GLOVE) ×2 IMPLANT
GLOVE ECLIPSE 6.5 STRL STRAW (GLOVE) IMPLANT
GLOVE ECLIPSE 8.0 STRL XLNG CF (GLOVE) ×1 IMPLANT
GLOVE SURG SS PI 7.0 STRL IVOR (GLOVE) IMPLANT
GOWN STRL REUS W/ TWL LRG LVL3 (GOWN DISPOSABLE) ×1 IMPLANT
GOWN STRL REUS W/ TWL XL LVL3 (GOWN DISPOSABLE) ×2 IMPLANT
GOWN STRL REUS W/TWL LRG LVL3 (GOWN DISPOSABLE) ×1
GOWN STRL REUS W/TWL XL LVL3 (GOWN DISPOSABLE) ×2
KIT INST RS STD STRATUM (KITS) IMPLANT
KIT STRATUM INSTRUMENT STD (KITS) IMPLANT
LOOP VASCLR MAXI BLUE 18IN ST (MISCELLANEOUS) IMPLANT
LOOP VASCULAR MAXI 18 BLUE (MISCELLANEOUS)
LOOP VASCULAR MINI 18 RED (MISCELLANEOUS)
LOOPS VASCLR MAXI BLUE 18IN ST (MISCELLANEOUS) IMPLANT
NDL HYPO 22X1.5 SAFETY MO (MISCELLANEOUS) ×1 IMPLANT
NDL SAFETY ECLIP 18X1.5 (MISCELLANEOUS) IMPLANT
NEEDLE HYPO 22X1.5 SAFETY MO (MISCELLANEOUS) ×1 IMPLANT
PACK BASIN DAY SURGERY FS (CUSTOM PROCEDURE TRAY) ×1 IMPLANT
PAD CAST 4YDX4 CTTN HI CHSV (CAST SUPPLIES) ×1 IMPLANT
PADDING CAST COTTON 4X4 STRL (CAST SUPPLIES) ×1
PADDING CAST COTTON 6X4 STRL (CAST SUPPLIES) ×1 IMPLANT
PENCIL SMOKE EVACUATOR (MISCELLANEOUS) ×1 IMPLANT
PLATE SNGL LISFRANC HOLE 3 (Plate) IMPLANT
PLATE STRATUM 5H RS (Plate) IMPLANT
SANITIZER HAND PURELL FF 515ML (MISCELLANEOUS) ×1 IMPLANT
SCREW  STRATUM NL LP ST 3.5X22 (Screw) ×1 IMPLANT
SCREW LOCK STRATUM 3.5X20 (Screw) IMPLANT
SCREW NL LP STRATUM 3.5X16 (Screw) IMPLANT
SCREW NLOCK RS STRM 2.4X10 STR (Screw) IMPLANT
SCREW NLOCK RS STRM STRL 2.4X1 (Screw) IMPLANT
SCREW STRATUM NL LP ST 3.5X22 (Screw) IMPLANT
SCREW STRATUM NL LP ST 3.5X24 (Screw) IMPLANT
SCREW STRATUM RS  NL 2.4X14 (Screw) ×1 IMPLANT
SCREW STRATUM RS NL 2.4X14 (Screw) IMPLANT
SCREW STRM LOCK 2.4X12 RS ST (Screw) IMPLANT
SHEET MEDIUM DRAPE 40X70 STRL (DRAPES) ×1 IMPLANT
SLEEVE SCD COMPRESS KNEE MED (STOCKING) ×1 IMPLANT
SPIKE FLUID TRANSFER (MISCELLANEOUS) IMPLANT
SPLINT PLASTER CAST FAST 5X30 (CAST SUPPLIES) ×20 IMPLANT
SPONGE SURGIFOAM ABS GEL 12-7 (HEMOSTASIS) IMPLANT
SPONGE T-LAP 18X18 ~~LOC~~+RFID (SPONGE) ×1 IMPLANT
STOCKINETTE 6  STRL (DRAPES) ×1
STOCKINETTE 6 STRL (DRAPES) ×1 IMPLANT
STRIP CLOSURE SKIN 1/2X4 (GAUZE/BANDAGES/DRESSINGS) IMPLANT
SUCTION FRAZIER HANDLE 10FR (MISCELLANEOUS) ×1
SUCTION TUBE FRAZIER 10FR DISP (MISCELLANEOUS) ×1 IMPLANT
SUT ETHILON 3 0 PS 1 (SUTURE) ×1 IMPLANT
SUT MNCRL AB 3-0 PS2 18 (SUTURE) ×1 IMPLANT
SUT VIC AB 2-0 SH 18 (SUTURE) IMPLANT
SUT VIC AB 2-0 SH 27 (SUTURE) ×1
SUT VIC AB 2-0 SH 27XBRD (SUTURE) ×1 IMPLANT
SUT VICRYL 0 SH 27 (SUTURE) IMPLANT
SYR BULB EAR ULCER 3OZ GRN STR (SYRINGE) ×1 IMPLANT
SYR CONTROL 10ML LL (SYRINGE) IMPLANT
TOWEL GREEN STERILE FF (TOWEL DISPOSABLE) ×2 IMPLANT
TUBE CONNECTING 20X1/4 (TUBING) ×1 IMPLANT
UNDERPAD 30X36 HEAVY ABSORB (UNDERPADS AND DIAPERS) ×1 IMPLANT
VASCULAR TIE MAXI BLUE 18IN ST (MISCELLANEOUS)
VASCULAR TIE MINI RED 18IN STL (MISCELLANEOUS) IMPLANT

## 2022-05-25 NOTE — Anesthesia Postprocedure Evaluation (Signed)
Anesthesia Post Note  Patient: Sharon Bolton  Procedure(s) Performed: Removal of deep implants left First metatarsal and medial cuneiform (Left: Foot) Revision left First tarsalmetatarsal joint arthrodesis with calcaneus autograft and 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 (Left: Foot)     Patient location during evaluation: PACU Anesthesia Type: General Level of consciousness: awake and alert Pain management: pain level controlled Vital Signs Assessment: post-procedure vital signs reviewed and stable Respiratory status: spontaneous breathing, nonlabored ventilation and respiratory function stable Cardiovascular status: blood pressure returned to baseline Postop Assessment: no apparent nausea or vomiting Anesthetic complications: no   No notable events documented.  Last Vitals:  Vitals:   05/25/22 0930 05/25/22 0945  BP: (!) 85/61 (!) 80/63  Pulse: 87 96  Resp: 12 20  Temp:    SpO2: 96% 96%    Last Pain:  Vitals:   05/25/22 0945  TempSrc:   PainSc: 0-No pain                 Marthenia Rolling

## 2022-05-25 NOTE — Transfer of Care (Signed)
Immediate Anesthesia Transfer of Care Note  Patient: Sharon Bolton  Procedure(s) Performed: removal of deep implants left 1st metatarsal and medial cuneiform (Left) revision left 1st tarsalmetatarsal joint arthrodesis with calcaneus autograft and open treatment of left 4th metatarsal nonunion (Left: Foot)  Patient Location: PACU  Anesthesia Type:GA combined with regional for post-op pain  Level of Consciousness: drowsy and patient cooperative  Airway & Oxygen Therapy: Patient Spontanous Breathing and Patient connected to face mask oxygen  Post-op Assessment: Report given to RN and Post -op Vital signs reviewed and stable  Post vital signs: Reviewed and stable  Last Vitals:  Vitals Value Taken Time  BP 97/54 05/25/22 0929  Temp 36.3 C 05/25/22 0929  Pulse 87 05/25/22 0930  Resp 12 05/25/22 0930  SpO2 96 % 05/25/22 0930  Vitals shown include unvalidated device data.  Last Pain:  Vitals:   05/25/22 0657  TempSrc: Oral  PainSc: 4          Complications: No notable events documented.

## 2022-05-25 NOTE — Anesthesia Procedure Notes (Signed)
Anesthesia Regional Block: Popliteal block   Pre-Anesthetic Checklist: , timeout performed,  Correct Patient, Correct Site, Correct Laterality,  Correct Procedure, Correct Position, site marked,  Risks and benefits discussed,  Pre-op evaluation,  At surgeon's request and post-op pain management  Laterality: Left  Prep: Maximum Sterile Barrier Precautions used, chloraprep       Needles:  Injection technique: Single-shot  Needle Type: Echogenic Stimulator Needle     Needle Length: 9cm  Needle Gauge: 22     Additional Needles:   Procedures:,,,, ultrasound used (permanent image in chart),,    Narrative:  Start time: 05/25/2022 7:16 AM End time: 05/25/2022 7:19 AM Injection made incrementally with aspirations every 5 mL.  Performed by: Personally  Anesthesiologist: Brennan Bailey, MD  Additional Notes: Risks, benefits, and alternative discussed. Patient gave consent for procedure. Patient prepped and draped in sterile fashion. Sedation administered, patient remains easily responsive to voice. Relevant anatomy identified with ultrasound guidance. Local anesthetic given in 5cc increments with no signs or symptoms of intravascular injection. No pain or paraesthesias with injection. Patient monitored throughout procedure with signs of LAST or immediate complications. Tolerated well. Ultrasound image placed in chart.  Tawny Asal, MD

## 2022-05-25 NOTE — Progress Notes (Signed)
Assisted Dr. Daiva Huge with left, popliteal/saphenous, ultrasound guided block. Side rails up, monitors on throughout procedure. See vital signs in flow sheet. Tolerated Procedure well.

## 2022-05-25 NOTE — Anesthesia Procedure Notes (Signed)
Procedure Name: LMA Insertion Date/Time: 05/25/2022 7:38 AM  Performed by: Sahra Converse, Ernesta Amble, CRNAPre-anesthesia Checklist: Patient identified, Emergency Drugs available, Suction available and Patient being monitored Patient Re-evaluated:Patient Re-evaluated prior to induction Oxygen Delivery Method: Circle system utilized Preoxygenation: Pre-oxygenation with 100% oxygen Induction Type: IV induction Ventilation: Mask ventilation without difficulty LMA: LMA inserted LMA Size: 4.0 Number of attempts: 1 Airway Equipment and Method: Bite block Placement Confirmation: positive ETCO2 Tube secured with: Tape Dental Injury: Teeth and Oropharynx as per pre-operative assessment

## 2022-05-25 NOTE — Discharge Instructions (Addendum)
Sharon Simmer, MD [facility]  Please read the following information regarding your care after surgery.  Medications  You only need a prescription for the narcotic pain medicine (ex. oxycodone, Percocet, Norco).  All of the other medicines listed below are available over the counter. ? Aleve 2 pills twice a day for the first 3 days after surgery. ? acetominophen (Tylenol) 650 mg every 4-6 hours as you need for minor to moderate pain ? oxycodone as prescribed for severe pain  Narcotic pain medicine (ex. oxycodone, Percocet, Vicodin) will cause constipation.  To prevent this problem, take the following medicines while you are taking any pain medicine. ? docusate sodium (Colace) 100 mg twice a day ? senna (Senokot) 2 tablets twice a day  ? To help prevent blood clots, take a baby aspirin (81 mg) twice a day for six weeks after surgery.  You should also get up every hour while you are awake to move around.    Weight Bearing ? Do not bear any weight on the operated leg or foot.  Cast / Splint / Dressing ? Keep your splint, cast or dressing clean and dry.  Don't put anything (coat hanger, pencil, etc) down inside of it.  If it gets damp, use a hair dryer on the cool setting to dry it.  If it gets soaked, call the office to schedule an appointment for a cast change.   After your dressing, cast or splint is removed; you may shower, but do not soak or scrub the wound.  Allow the water to run over it, and then gently pat it dry.  Swelling It is normal for you to have swelling where you had surgery.  To reduce swelling and pain, keep your toes above your nose for at least 3 days after surgery.  It may be necessary to keep your foot or leg elevated for several weeks.  If it hurts, it should be elevated.  Follow Up Call my office at [phone] when you are discharged from the hospital or surgery center to schedule an appointment to be seen two weeks after surgery.  Call my office at [phone] if  you develop a fever >101.5 F, nausea, vomiting, bleeding from the surgical site or severe pain.     No Tylenol until after 1:00pm, if needed.   Post Anesthesia Home Care Instructions  Activity: Get plenty of rest for the remainder of the day. A responsible individual must stay with you for 24 hours following the procedure.  For the next 24 hours, DO NOT: -Drive a car -Paediatric nurse -Drink alcoholic beverages -Take any medication unless instructed by your physician -Make any legal decisions or sign important papers.  Meals: Start with liquid foods such as gelatin or soup. Progress to regular foods as tolerated. Avoid greasy, spicy, heavy foods. If nausea and/or vomiting occur, drink only clear liquids until the nausea and/or vomiting subsides. Call your physician if vomiting continues.  Special Instructions/Symptoms: Your throat may feel dry or sore from the anesthesia or the breathing tube placed in your throat during surgery. If this causes discomfort, gargle with warm salt water. The discomfort should disappear within 24 hours.  If you had a scopolamine patch placed behind your ear for the management of post- operative nausea and/or vomiting:  1. The medication in the patch is effective for 72 hours, after which it should be removed.  Wrap patch in a tissue and discard in the trash. Wash hands thoroughly with soap and water. 2. You may remove  the patch earlier than 72 hours if you experience unpleasant side effects which may include dry mouth, dizziness or visual disturbances. 3. Avoid touching the patch. Wash your hands with soap and water after contact with the patch.   Regional Anesthesia Blocks  1. Numbness or the inability to move the "blocked" extremity may last from 3-48 hours after placement. The length of time depends on the medication injected and your individual response to the medication. If the numbness is not going away after 48 hours, call your surgeon.  2. The  extremity that is blocked will need to be protected until the numbness is gone and the  Strength has returned. Because you cannot feel it, you will need to take extra care to avoid injury. Because it may be weak, you may have difficulty moving it or using it. You may not know what position it is in without looking at it while the block is in effect.  3. For blocks in the legs and feet, returning to weight bearing and walking needs to be done carefully. You will need to wait until the numbness is entirely gone and the strength has returned. You should be able to move your leg and foot normally before you try and bear weight or walk. You will need someone to be with you when you first try to ensure you do not fall and possibly risk injury.  4. Bruising and tenderness at the needle site are common side effects and will resolve in a few days.  5. Persistent numbness or new problems with movement should be communicated to the surgeon or the [facility] [phone] [place] [phone]).Information for Discharge Teaching: EXPAREL (bupivacaine liposome injectable suspension)   Your surgeon or anesthesiologist gave you EXPAREL(bupivacaine) to help control your pain after surgery.  EXPAREL is a local anesthetic that provides pain relief by numbing the tissue around the surgical site. EXPAREL is designed to release pain medication over time and can control pain for up to 72 hours. Depending on how you respond to EXPAREL, you may require less pain medication during your recovery.  Possible side effects: Temporary loss of sensation or ability to move in the area where bupivacaine was injected. Nausea, vomiting, constipation Rarely, numbness and tingling in your mouth or lips, lightheadedness, or anxiety may occur. Call your doctor right away if you think you may be experiencing any of these sensations, or if you have other questions regarding possible side effects.  Follow  all other discharge instructions given to you by your surgeon or nurse. Eat a healthy diet and drink plenty of water or other fluids.  If you return to the hospital for any reason within 96 hours following the administration of EXPAREL, it is important for health care providers to know that you have received this anesthetic. A teal colored band has been placed on your arm with the date, time and amount of EXPAREL you have received in order to alert and inform your health care providers. Please leave this armband in place for the full 96 hours following administration, and then you may remove the band.

## 2022-05-25 NOTE — Op Note (Signed)
05/25/2022  9:33 AM  PATIENT:  Sharon Bolton  47 y.o. female  PRE-OPERATIVE DIAGNOSIS: 1.  Left foot medial cuneiform nonunion 2.  Painful hardware left foot medial cuneiform 3.  Painful hardware left foot first metatarsal 4.  Left fourth metatarsal fracture nonunion  POST-OPERATIVE DIAGNOSIS: Same  Procedure(s): 1.  Removal of deep implants left foot medial cuneiform 2.  Removal of deep implants left foot first metatarsal 3.  Open treatment of left foot medial cuneiform nonunion with internal fixation 4.  Large autograft from the calcaneus to the medial cuneiform 5.  Open treatment of left foot fourth metatarsal nonunion with autograft 6.  AP, oblique and lateral radiographs of the left foot  SURGEON:  Wylene Simmer, MD  ASSISTANT: Mechele Claude, PA-C  ANESTHESIA:   General, regional  EBL:  minimal   TOURNIQUET: Approximately 1 and half hours at 250 mmHg thigh  COMPLICATIONS:  None apparent  DISPOSITION:  Extubated, awake and stable to recovery.  INDICATION FOR PROCEDURE: 47 year old female with past medical history significant for smoking complains of chronic pain in the left foot due to a nonunion at the first tarsometatarsal joint between the medial cuneiform and the first metatarsal base as well as painful hardware at both bones.  She also has a painful nonunion of the fourth metatarsal.  She has failed nonoperative treatment to date and presents today for surgery.  After pre operative consent was obtained, and the correct operative site was identified, the patient was brought to the operating room and placed supine on the OR table.  Anesthesia was administered.  Pre-operative antibiotics were administered.  A surgical timeout was taken.   PROCEDURE IN DETAIL:  After pre operative consent was obtained, and the correct operative site was identified, the patient was brought to the operating room and placed supine on the OR table.  Anesthesia was administered.  Pre-operative  antibiotics were administered.  A surgical timeout was taken.  The left lower extremity was prepped and draped in standard sterile fashion with a tourniquet around the thigh.  The extremity was elevated, and the tourniquet was inflated to 250 mmHg.  The previous incision was identified over the first TMT joint.  It was opened again sharply and dissection carried down through the subcutaneous tissues.  The interval between the EHL and tibialis anterior was developed.  Care was taken to protect both tendons.  The distal screw across the first TMT joint was identified at the first metatarsal.  Overgrown bone was removed with a curette and rondure.  The screw was removed without difficulty.  Dissection was carried proximally.  The proximal screw was identified at the medial cuneiform.  It was cleaned of all overgrown bone and removed without difficulty.  The joint was then identified with a #15 scalpel.  A curette was used to open the joint followed by osteotomes.  All fibrous tissue was removed.  The wound was irrigated copiously.  A small drill bit was used to perforate both sides of the joint.  Attention was turned to the hindfoot where an oblique incision was made over the lateral wall of the calcaneus.  An 8 mm trephine was used to harvest autograft bone.  It was morselized on the back table and mixed with [facility] medical augment.  Bone graft was then packed into the first TMT joint.  The joint was reduced.  A [employer] stratum Lisfranc template was placed over the joint and pinned.  Radiographs confirmed appropriate position of the plate and appropriate  reduction of the joint.  The template was removed.  The plate was inserted and impacted into position at the dorsal medial cuneiform.  The plate was further secured to the cuneiform with a 3.5 mm bicortical nonlocking screw.  The compression slot was then drilled and a compression pin inserted.  The joint was compressed.  The plate was secured to bone distally  with a bicortical nonlocking screw followed by a bicortical locking screw.  The compression pin and ramp were removed and replaced with a bicortical nonlocking screw.  More bone graft was packed into the joint.  The wound was irrigated.  AP and lateral radiographs confirmed appropriate reduction of the first TMT joint and appropriate position and length of all hardware.  Vancomycin powder was sprinkled in the wound.  The wound was closed with Monocryl and nylon.  Attention was then turned to the fourth metatarsal.  A longitudinal incision was made and dissection carried down through the subcutaneous tissues.  Care was taken to protect the extensor tendons.  The nonunion site was identified.  It was cleaned of all sclerotic bone and fibrous tissue.  The fracture site was opened.  Both sides were perforated with a small drill bit.  The fracture site was then packed with autograft bone from the calcaneus mixed with [facility] medical augment.  The fracture was provisionally pinned.  A [employer] stratum RS 5 hole plate template was placed over the fracture site.  It was provisionally pinned.  Radiographs confirmed appropriate position of the plate.  The template was removed.  The plate was inserted and impacted into position distally.  It was fixed distally also with a bicortical nonlocking screw.  Proximally the compression slot was drilled and a compression pin inserted.  The fracture site was compressed.  The plate was secured proximally with a locking bicortical screw and a nonlocking bicortical screw.  The compression pin was removed and replaced with a nonlocking bicortical screw.  AP, lateral and oblique radiographs confirmed appropriate reduction of the fracture and appropriate position and length of all hardware.  The remaining bone was packed around the fracture site medially and laterally.  The wound was irrigated and sprinkled with vancomycin powder.  Incision was closed with horizontal mattress sutures of  3-0 nylon.  A Gelfoam was placed in the calcaneus autograft site after irrigating.  The wound was closed with nylon.  Sterile dressings were applied followed by a well-padded short leg splint.  The tourniquet was released after application of the dressings.  The patient was awakened from anesthesia and transported to the recovery room in stable condition.  FOLLOW UP PLAN: Nonweightbearing on the left lower extremity.  Follow-up in the office in 2 weeks for suture removal and conversion to a short leg cast.  Aspirin for DVT prophylaxis.  Plan 6 weeks postoperative nonweightbearing immobilization.   RADIOGRAPHS: AP, lateral and oblique radiographs of the left foot are obtained intraoperatively.  These show interval hardware removal from the first metatarsal and medial cuneiform as well as interval treatment of nonunion with plating across the first TMT joint and across the fourth metatarsal fracture.  Hardware is appropriately positioned and of the appropriate lengths.  No other acute injuries are noted.    Mechele Claude PA-C was present and scrubbed for the duration of the operative case. His assistance was essential in positioning the patient, prepping and draping, gaining and maintaining exposure, performing the operation, closing and dressing the wounds and applying the splint.

## 2022-05-25 NOTE — Anesthesia Procedure Notes (Signed)
Anesthesia Regional Block: Adductor canal block   Pre-Anesthetic Checklist: , timeout performed,  Correct Patient, Correct Site, Correct Laterality,  Correct Procedure, Correct Position, site marked,  Risks and benefits discussed,  Pre-op evaluation,  At surgeon's request and post-op pain management  Laterality: Left  Prep: Maximum Sterile Barrier Precautions used, chloraprep       Needles:  Injection technique: Single-shot  Needle Type: Echogenic Stimulator Needle     Needle Length: 9cm  Needle Gauge: 22     Additional Needles:   Procedures:,,,, ultrasound used (permanent image in chart),,    Narrative:  Start time: 05/25/2022 7:14 AM End time: 05/25/2022 7:16 AM Injection made incrementally with aspirations every 5 mL.  Performed by: Personally  Anesthesiologist: Brennan Bailey, MD  Additional Notes: Risks, benefits, and alternative discussed. Patient gave consent for procedure. Patient prepped and draped in sterile fashion. Sedation administered, patient remains easily responsive to voice. Relevant anatomy identified with ultrasound guidance. Local anesthetic given in 5cc increments with no signs or symptoms of intravascular injection. No pain or paraesthesias with injection. Patient monitored throughout procedure with signs of LAST or immediate complications. Tolerated well. Ultrasound image placed in chart.  Tawny Asal, MD

## 2022-05-26 ENCOUNTER — Encounter (HOSPITAL_BASED_OUTPATIENT_CLINIC_OR_DEPARTMENT_OTHER): Payer: Self-pay | Admitting: Orthopedic Surgery

## 2022-06-01 DIAGNOSIS — F4310 Post-traumatic stress disorder, unspecified: Secondary | ICD-10-CM | POA: Diagnosis not present

## 2022-06-08 DIAGNOSIS — Z4889 Encounter for other specified surgical aftercare: Secondary | ICD-10-CM | POA: Diagnosis not present

## 2022-06-08 DIAGNOSIS — S92342K Displaced fracture of fourth metatarsal bone, left foot, subsequent encounter for fracture with nonunion: Secondary | ICD-10-CM | POA: Diagnosis not present

## 2022-06-13 ENCOUNTER — Other Ambulatory Visit (HOSPITAL_COMMUNITY)
Admission: RE | Admit: 2022-06-13 | Discharge: 2022-06-13 | Disposition: A | Discharge status: Home / Self Care | Payer: Medicare HMO | Source: Ambulatory Visit | Attending: Family Medicine | Admitting: Family Medicine

## 2022-06-13 ENCOUNTER — Ambulatory Visit (INDEPENDENT_AMBULATORY_CARE_PROVIDER_SITE_OTHER): Payer: Medicare HMO | Admitting: Family Medicine

## 2022-06-13 ENCOUNTER — Encounter: Payer: Self-pay | Admitting: Family Medicine

## 2022-06-13 VITALS — BP 127/87 | HR 83 | Temp 97.7°F | Ht 65.0 in | Wt 150.0 lb

## 2022-06-13 DIAGNOSIS — B9689 Other specified bacterial agents as the cause of diseases classified elsewhere: Secondary | ICD-10-CM | POA: Diagnosis not present

## 2022-06-13 DIAGNOSIS — R3915 Urgency of urination: Secondary | ICD-10-CM

## 2022-06-13 DIAGNOSIS — F4310 Post-traumatic stress disorder, unspecified: Secondary | ICD-10-CM | POA: Diagnosis not present

## 2022-06-13 DIAGNOSIS — Z113 Encounter for screening for infections with a predominantly sexual mode of transmission: Secondary | ICD-10-CM | POA: Diagnosis not present

## 2022-06-13 DIAGNOSIS — N760 Acute vaginitis: Secondary | ICD-10-CM | POA: Diagnosis not present

## 2022-06-13 DIAGNOSIS — M545 Low back pain, unspecified: Secondary | ICD-10-CM | POA: Diagnosis not present

## 2022-06-13 DIAGNOSIS — R1030 Lower abdominal pain, unspecified: Secondary | ICD-10-CM | POA: Insufficient documentation

## 2022-06-13 LAB — POC URINALSYSI DIPSTICK (AUTOMATED)
Glucose, UA: NEGATIVE
Ketones, UA: 15
Leukocytes, UA: NEGATIVE
Nitrite, UA: NEGATIVE
Protein, UA: NEGATIVE
Spec Grav, UA: 1.02 (ref 1.010–1.025)
Urobilinogen, UA: 0.2 E.U./dL
pH, UA: 6.5 — AB (ref 5.0–8.0)

## 2022-06-13 LAB — URINALYSIS, MICROSCOPIC ONLY

## 2022-06-13 NOTE — Progress Notes (Signed)
Acute Office Visit  Subjective:     Patient ID: Sharon Bolton, female    DOB: Oct 27, 1975, 47 y.o.   MRN: [id number]  CC: UTI   HPI   Urinary Tract Infection: Patient complains of  left lower back ache, suprapubic pressure, urgency, frequency, trouble emptying bladder, urethral irritation.  She has had symptoms for 4 days. Patient denies  dysuria, fevers, chills, hematuria, vaginal discharge . She is sexually active - would like routine screening with self swab. She has not yet taken any OTC medications, but is trying to stay well hydrated.       ROS All review of systems negative except what is listed in the HPI      Objective:    BP 127/87   Pulse 83   Temp 97.7 F (36.5 C) (Oral)   Ht 5\' 5"  (1.651 m)   Wt 150 lb (68 kg)   SpO2 99%   BMI 24.96 kg/m    Physical Exam Vitals reviewed.  Constitutional:      Appearance: Normal appearance.  Cardiovascular:     Rate and Rhythm: Normal rate and regular rhythm.     Pulses: Normal pulses.     Heart sounds: Normal heart sounds.  Pulmonary:     Effort: Pulmonary effort is normal.     Breath sounds: Normal breath sounds.  Abdominal:     General: Abdomen is flat. Bowel sounds are normal. There is no distension.     Palpations: Abdomen is soft.     Tenderness: There is no abdominal tenderness. There is no right CVA tenderness, left CVA tenderness or guarding.  Skin:    General: Skin is warm and dry.  Neurological:     Mental Status: She is alert and oriented to person, place, and time.  Psychiatric:        Mood and Affect: Mood normal.        Behavior: Behavior normal.        Thought Content: Thought content normal.        Judgment: Judgment normal.     Results for orders placed or performed in visit on 06/13/22  POCT Urinalysis Dipstick (Automated)  Result Value Ref Range   Color, UA yellow    Clarity, UA clear    Glucose, UA Negative Negative   Bilirubin, UA small    Ketones, UA 15    Spec Grav, UA  1.020 1.010 - 1.025   Blood, UA trace    pH, UA 6.5 (A) 5.0 - 8.0   Protein, UA Negative Negative   Urobilinogen, UA 0.2 0.2 or 1.0 E.U./dL   Nitrite, UA negative    Leukocytes, UA Negative Negative        Assessment & Plan:   Problem List Items Addressed This Visit   None Visit Diagnoses     Acute bilateral low back pain without sciatica    -  Primary   Relevant Orders   POCT Urinalysis Dipstick (Automated) (Completed)   Urine Microscopic Only   Urine Culture   Urgency of urination       Relevant Orders   POCT Urinalysis Dipstick (Automated) (Completed)   Urine Microscopic Only   Urine Culture   Cervicovaginal ancillary only   Lower abdominal pain       Relevant Orders   POCT Urinalysis Dipstick (Automated) (Completed)   Urine Microscopic Only   Urine Culture   Cervicovaginal ancillary only   Screen for STD (sexually transmitted disease)  Relevant Orders   Cervicovaginal ancillary only     No alarm findings on exam. Reports consistent with previous UTIs. Will send for culture and micro.  Recommended increasing hydration, avoid bladder irritants. Adding STD screening -self swab.  Will update with results and plan. Requesting Diflucan if antibiotics are added after results.  Patient aware of signs/symptoms requiring further/urgent evaluation.     No orders of the defined types were placed in this encounter.   Return if symptoms worsen or fail to improve.  Clayborne Dana, NP

## 2022-06-13 NOTE — Patient Instructions (Signed)
Urine without obvious signs of infection. Will send to the lab to further evaluate and let you know results.  In the meantime, also screening for STDs.  Please stay well hydrated. Avoid bladder irritants (caffeine, coffee, chocolate, spicy foods, citrus).

## 2022-06-14 LAB — CERVICOVAGINAL ANCILLARY ONLY
Bacterial Vaginitis (gardnerella): POSITIVE — AB
Candida Glabrata: NEGATIVE
Candida Vaginitis: NEGATIVE
Chlamydia: NEGATIVE
Comment: NEGATIVE
Comment: NEGATIVE
Comment: NEGATIVE
Comment: NEGATIVE
Comment: NEGATIVE
Comment: NORMAL
Neisseria Gonorrhea: NEGATIVE
Trichomonas: NEGATIVE

## 2022-06-14 LAB — URINE CULTURE
MICRO NUMBER:: 14800439
SPECIMEN QUALITY:: ADEQUATE

## 2022-06-14 MED ORDER — METRONIDAZOLE 500 MG PO TABS: 500.0000 mg | ORAL_TABLET | Freq: Two times a day (BID) | ORAL | 0 refills | Status: AC

## 2022-06-14 MED ORDER — FLUCONAZOLE 150 MG PO TABS: 150.0000 mg | ORAL_TABLET | Freq: Every day | ORAL | 0 refills | Status: DC

## 2022-06-14 NOTE — Addendum Note (Signed)
Addended by: Hyman Hopes B on: 06/14/2022 01:05 PM   Modules accepted: Orders

## 2022-06-19 ENCOUNTER — Encounter: Payer: Self-pay | Admitting: *Deleted

## 2022-06-20 DIAGNOSIS — F4310 Post-traumatic stress disorder, unspecified: Secondary | ICD-10-CM | POA: Diagnosis not present

## 2022-06-27 DIAGNOSIS — F4310 Post-traumatic stress disorder, unspecified: Secondary | ICD-10-CM | POA: Diagnosis not present

## 2022-07-03 ENCOUNTER — Encounter: Payer: Self-pay | Admitting: Medical

## 2022-07-03 ENCOUNTER — Ambulatory Visit (INDEPENDENT_AMBULATORY_CARE_PROVIDER_SITE_OTHER): Payer: Medicare HMO | Admitting: Medical

## 2022-07-03 VITALS — BP 134/64 | HR 95 | Resp 18 | Ht 65.0 in | Wt 152.8 lb

## 2022-07-03 DIAGNOSIS — E663 Overweight: Secondary | ICD-10-CM | POA: Diagnosis not present

## 2022-07-03 DIAGNOSIS — F17200 Nicotine dependence, unspecified, uncomplicated: Secondary | ICD-10-CM | POA: Diagnosis not present

## 2022-07-03 MED ORDER — METFORMIN HCL 500 MG PO TABS: 500.0000 mg | ORAL_TABLET | Freq: Two times a day (BID) | ORAL | 0 refills | Status: DC

## 2022-07-03 MED ORDER — NICOTINE 21 MG/24HR TD PT24: 21.0000 mg | MEDICATED_PATCH | Freq: Every day | TRANSDERMAL | 0 refills | Status: AC
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
## 2022-07-03 NOTE — Patient Instructions (Signed)
Recently overweight with weight gain after surgery and he became more sedentary while eating excessively.  Some of this might be related to high-dose use of gabapentin and course of prednisone.  I do think you could lose weight with weight watchers diet and could also prescribe metformin as some people can lose weight with metformin.  We had discussed use of phentermine and typically do not prescribe phentermine due to increase in blood pressure, pulse and eventually patients will regain weight once phentermine is stopped.  For smoking cessation prescribed nicotine patches. Continue wellbutrin.  Follow up in one month or sooner if needed.

## 2022-07-04 DIAGNOSIS — F4310 Post-traumatic stress disorder, unspecified: Secondary | ICD-10-CM | POA: Diagnosis not present

## 2022-07-09 ENCOUNTER — Other Ambulatory Visit: Payer: Self-pay | Admitting: Medical

## 2022-07-10 DIAGNOSIS — Z4889 Encounter for other specified surgical aftercare: Secondary | ICD-10-CM | POA: Diagnosis not present

## 2022-07-10 DIAGNOSIS — S92342K Displaced fracture of fourth metatarsal bone, left foot, subsequent encounter for fracture with nonunion: Secondary | ICD-10-CM | POA: Diagnosis not present

## 2022-07-11 DIAGNOSIS — F4310 Post-traumatic stress disorder, unspecified: Secondary | ICD-10-CM | POA: Diagnosis not present

## 2022-07-14 DIAGNOSIS — F4310 Post-traumatic stress disorder, unspecified: Secondary | ICD-10-CM | POA: Diagnosis not present

## 2022-07-19 DIAGNOSIS — H52209 Unspecified astigmatism, unspecified eye: Secondary | ICD-10-CM | POA: Diagnosis not present

## 2022-07-19 DIAGNOSIS — H5203 Hypermetropia, bilateral: Secondary | ICD-10-CM | POA: Diagnosis not present

## 2022-07-19 DIAGNOSIS — H524 Presbyopia: Secondary | ICD-10-CM | POA: Diagnosis not present

## 2022-08-01 DIAGNOSIS — F4310 Post-traumatic stress disorder, unspecified: Secondary | ICD-10-CM | POA: Diagnosis not present

## 2022-08-02 ENCOUNTER — Ambulatory Visit (INDEPENDENT_AMBULATORY_CARE_PROVIDER_SITE_OTHER): Payer: Medicare HMO | Admitting: Medical

## 2022-08-02 ENCOUNTER — Encounter: Payer: Self-pay | Admitting: Medical

## 2022-08-02 VITALS — BP 122/72 | HR 81 | Resp 18 | Ht 64.0 in | Wt 151.0 lb

## 2022-08-02 DIAGNOSIS — E663 Overweight: Secondary | ICD-10-CM | POA: Diagnosis not present

## 2022-08-02 DIAGNOSIS — R6889 Other general symptoms and signs: Secondary | ICD-10-CM | POA: Diagnosis not present

## 2022-08-02 MED ORDER — PHENTERMINE HCL 37.5 MG PO CAPS: 37.5000 mg | ORAL_CAPSULE | ORAL | 0 refills | Status: DC

## 2022-08-02 NOTE — Patient Instructions (Addendum)
Overweight and recent weight gain 30 lbs. Formerly low body weight. Failed metformin  recently due to side effects. Recent stimulant dc'd by psychiatrist though last rx was filled. Discussed in detail not to resume use of add stimulant med. Pt expressed understanding. Rx phentermine 37.5 mg tab daily. Rx advisement given. At most plan on writing just 2 months provided no advserse side effects.  Follow up one month or sooner if needed.
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
## 2022-08-08 DIAGNOSIS — F4310 Post-traumatic stress disorder, unspecified: Secondary | ICD-10-CM | POA: Diagnosis not present

## 2022-08-14 DIAGNOSIS — Z4889 Encounter for other specified surgical aftercare: Secondary | ICD-10-CM | POA: Diagnosis not present

## 2022-08-14 DIAGNOSIS — S92342K Displaced fracture of fourth metatarsal bone, left foot, subsequent encounter for fracture with nonunion: Secondary | ICD-10-CM | POA: Diagnosis not present

## 2022-08-14 DIAGNOSIS — R6889 Other general symptoms and signs: Secondary | ICD-10-CM | POA: Diagnosis not present

## 2022-08-15 DIAGNOSIS — F4310 Post-traumatic stress disorder, unspecified: Secondary | ICD-10-CM | POA: Diagnosis not present

## 2022-08-16 ENCOUNTER — Other Ambulatory Visit: Payer: Self-pay | Admitting: Medical

## 2022-08-18 DIAGNOSIS — F4310 Post-traumatic stress disorder, unspecified: Secondary | ICD-10-CM | POA: Diagnosis not present

## 2022-08-29 DIAGNOSIS — F4310 Post-traumatic stress disorder, unspecified: Secondary | ICD-10-CM | POA: Diagnosis not present

## 2022-08-31 ENCOUNTER — Other Ambulatory Visit: Payer: Self-pay | Admitting: Medical

## 2022-08-31 NOTE — Telephone Encounter (Signed)
Rx phentermine sent to pharmacy

## 2022-09-04 ENCOUNTER — Ambulatory Visit: Payer: Medicare HMO | Admitting: Medical

## 2022-09-05 DIAGNOSIS — F4310 Post-traumatic stress disorder, unspecified: Secondary | ICD-10-CM | POA: Diagnosis not present

## 2022-09-06 DIAGNOSIS — F901 Attention-deficit hyperactivity disorder, predominantly hyperactive type: Secondary | ICD-10-CM | POA: Diagnosis not present

## 2022-09-06 DIAGNOSIS — F319 Bipolar disorder, unspecified: Secondary | ICD-10-CM | POA: Diagnosis not present

## 2022-09-06 DIAGNOSIS — D649 Anemia, unspecified: Secondary | ICD-10-CM | POA: Diagnosis not present

## 2022-09-06 DIAGNOSIS — A64 Unspecified sexually transmitted disease: Secondary | ICD-10-CM | POA: Diagnosis not present

## 2022-09-06 DIAGNOSIS — S92342K Displaced fracture of fourth metatarsal bone, left foot, subsequent encounter for fracture with nonunion: Secondary | ICD-10-CM | POA: Diagnosis not present

## 2022-09-06 DIAGNOSIS — K219 Gastro-esophageal reflux disease without esophagitis: Secondary | ICD-10-CM | POA: Diagnosis not present

## 2022-09-06 DIAGNOSIS — G43909 Migraine, unspecified, not intractable, without status migrainosus: Secondary | ICD-10-CM | POA: Diagnosis not present

## 2022-09-06 DIAGNOSIS — G40909 Epilepsy, unspecified, not intractable, without status epilepticus: Secondary | ICD-10-CM | POA: Diagnosis not present

## 2022-09-06 DIAGNOSIS — F419 Anxiety disorder, unspecified: Secondary | ICD-10-CM | POA: Diagnosis not present

## 2022-09-08 ENCOUNTER — Other Ambulatory Visit: Payer: Self-pay | Admitting: Medical

## 2022-09-08 DIAGNOSIS — J302 Other seasonal allergic rhinitis: Secondary | ICD-10-CM

## 2022-09-11 DIAGNOSIS — A64 Unspecified sexually transmitted disease: Secondary | ICD-10-CM | POA: Diagnosis not present

## 2022-09-11 DIAGNOSIS — G40909 Epilepsy, unspecified, not intractable, without status epilepticus: Secondary | ICD-10-CM | POA: Diagnosis not present

## 2022-09-11 DIAGNOSIS — F419 Anxiety disorder, unspecified: Secondary | ICD-10-CM | POA: Diagnosis not present

## 2022-09-11 DIAGNOSIS — F901 Attention-deficit hyperactivity disorder, predominantly hyperactive type: Secondary | ICD-10-CM | POA: Diagnosis not present

## 2022-09-11 DIAGNOSIS — K219 Gastro-esophageal reflux disease without esophagitis: Secondary | ICD-10-CM | POA: Diagnosis not present

## 2022-09-11 DIAGNOSIS — S92342K Displaced fracture of fourth metatarsal bone, left foot, subsequent encounter for fracture with nonunion: Secondary | ICD-10-CM | POA: Diagnosis not present

## 2022-09-11 DIAGNOSIS — G43909 Migraine, unspecified, not intractable, without status migrainosus: Secondary | ICD-10-CM | POA: Diagnosis not present

## 2022-09-11 DIAGNOSIS — F319 Bipolar disorder, unspecified: Secondary | ICD-10-CM | POA: Diagnosis not present

## 2022-09-11 DIAGNOSIS — D649 Anemia, unspecified: Secondary | ICD-10-CM | POA: Diagnosis not present

## 2022-09-12 ENCOUNTER — Ambulatory Visit (INDEPENDENT_AMBULATORY_CARE_PROVIDER_SITE_OTHER): Payer: Medicare HMO | Admitting: Medical

## 2022-09-12 ENCOUNTER — Encounter: Payer: Self-pay | Admitting: Medical

## 2022-09-12 VITALS — BP 109/68 | HR 88 | Temp 98.0°F | Resp 18 | Ht 64.0 in | Wt 144.8 lb

## 2022-09-12 DIAGNOSIS — R6889 Other general symptoms and signs: Secondary | ICD-10-CM | POA: Diagnosis not present

## 2022-09-12 DIAGNOSIS — F17200 Nicotine dependence, unspecified, uncomplicated: Secondary | ICD-10-CM | POA: Diagnosis not present

## 2022-09-12 DIAGNOSIS — R062 Wheezing: Secondary | ICD-10-CM

## 2022-09-12 DIAGNOSIS — J449 Chronic obstructive pulmonary disease, unspecified: Secondary | ICD-10-CM

## 2022-09-12 DIAGNOSIS — E663 Overweight: Secondary | ICD-10-CM | POA: Diagnosis not present

## 2022-09-12 DIAGNOSIS — J302 Other seasonal allergic rhinitis: Secondary | ICD-10-CM | POA: Diagnosis not present

## 2022-09-12 MED ORDER — FLUTICASONE PROPIONATE 50 MCG/ACT NA SUSP: NASAL | 0 refills | Status: DC

## 2022-09-12 MED ORDER — FLUTICASONE PROPIONATE 50 MCG/ACT NA SUSP: 2.0000 | Freq: Every day | NASAL | 1 refills | Status: DC

## 2022-09-12 MED ORDER — ATROVENT HFA 17 MCG/ACT IN AERS: 2.0000 | INHALATION_SPRAY | Freq: Four times a day (QID) | RESPIRATORY_TRACT | 12 refills | Status: AC | PRN

## 2022-09-12 MED ORDER — PHENTERMINE HCL 37.5 MG PO CAPS: 37.5000 mg | ORAL_CAPSULE | ORAL | 0 refills | Status: DC

## 2022-09-12 NOTE — Patient Instructions (Signed)
Weight Gain: Recent weight gain of 30 pounds. Tolerating Phentermine 37.5 mg daily with no side effects and has lost 9 pounds since last visit. -Continue Phentermine 37.5 mg daily for one more month. -Discontinue Phentermine after one month due to not being recommended for long-term use.  Allergic Rhinitis: Requested refill for Flonase. -Refill Flonase prescription.  COPD/Asthma: Increased smoking leading to increased coughing and wheezing. Currently on Symbicort and Albuterol. -Refer to pulmonologist for possible pulmonary function tests and CT lung cancer screening. -Continue Symbicort two puffs twice a day. -Use Albuterol as needed, up to every six hours. -Add Atrovent, up to two puffs every six hours. -Consider changing inhalers to Trelegy pending pulmonologist's recommendation.  Nicotine Dependence: Increased smoking to 2-3 packs per day. Attempted to quit with nicotine patch but unsuccessful. -Encourage to cut back smoking by at least half. -Consider use of nicotine patch.  Foot Injury: Recently out of boot and starting physical therapy. Limited ability to exercise contributing to weight gain. -Encourage gradual increase in exercise as foot heals.     Follow up 1 month or sooner if needed.

## 2022-09-12 NOTE — Progress Notes (Signed)
Subjective:    Patient ID: Sharon Bolton, female    DOB: February 29, 1976, 47 y.o.   MRN: [id number]  HPI  Discussed the use of AI scribe software for clinical note transcription with the patient, who gave verbal consent to proceed.  History of Present Illness   The patient, with a history of weight gain, was previously prescribed metformin but could not tolerate it due to side effects. She was then prescribed phentermine 37.5 mg daily, which she has been tolerating well, with no reported side effects such as insomnia or fast heart rate. However, she noted that consuming caffeine with the medication made her feel jittery, so she has switched to decaffeinated coffee. Since starting the phentermine, she has lost nine pounds. She has a history of fluctuating weight, with her weight previously being in the 130s before her recent weight gain. She is currently in a boot due to a foot condition and has just started physical therapy. She anticipates being able to increase her physical activity in the coming weeks.      She also reported needing refills on her Flonase for allergies.     The patient also reported a recent increase in her smoking habit, now smoking two to three packs a day, up from one pack a day. This has been accompanied by an increase in coughing and wheezing over the past two months. She is currently using Symbicort and albuterol for these symptoms and inquired about the possibility of using Flovent as well. She has a long history of smoking, having smoked for at least two decades, sometimes more than a pack a day.  In addition to these issues, the patient was previously prescribed prednisone for lung infections, which she reported led to a significant increase in her appetite and subsequent weight gain. She has not been coughing up mucus and has not had a CT of her chest for lung cancer screening.       Review of Systems See hpi.    Objective:   Physical  Exam  General Mental Status- Alert. General Appearance- Not in acute distress.   Skin General: Color- Normal Color. Moisture- Normal Moisture.  Neck Carotid Arteries- Normal color. Moisture- Normal Moisture. No carotid bruits. No JVD.  Chest and Lung Exam Auscultation: Breath Sounds:-mild shallow breathing. But even and unlabored. No wheezing presently.  Cardiovascular Auscultation:Rythm- Regular. Murmurs & Other Heart Sounds:Auscultation of the heart reveals- No Murmurs.  Abdomen Inspection:-Inspeection Normal. Palpation/Percussion:Note:No mass. Palpation and Percussion of the abdomen reveal- Non Tender, Non Distended + BS, no rebound or guarding.    Neurologic Cranial Nerve exam:- CN III-XII intact(No nystagmus), symmetric smile. Strength:- 5/5 equal and symmetric strength both upper and lower extremities.       Assessment & Plan:   Assessment and Plan    Weight Gain: Recent weight gain of 30 pounds. Tolerating Phentermine 37.5 mg daily with no side effects and has lost 9 pounds since last visit. -Continue Phentermine 37.5 mg daily for one more month. -Discontinue Phentermine after one month due to not being recommended for long-term use.  Allergic Rhinitis: Requested refill for Flonase. -Refill Flonase prescription.  COPD/Asthma: Increased smoking leading to increased coughing and wheezing. Currently on Symbicort and Albuterol. -Refer to pulmonologist for possible pulmonary function tests and CT lung cancer screening. -Continue Symbicort two puffs twice a day. -Use Albuterol as needed, up to every six hours. -Add Atrovent, up to two puffs every six hours. -Consider changing inhalers to Trelegy pending pulmonologist's recommendation.  Nicotine  Dependence: Increased smoking to 2-3 packs per day. Attempted to quit with nicotine patch but unsuccessful. -Encourage to cut back smoking by at least half. -Consider use of nicotine patch.  Foot Injury: Recently out of  boot and starting physical therapy. Limited ability to exercise contributing to weight gain. -Encourage gradual increase in exercise as foot heals.     Follow up 1 month or sooner if needed.        Esperanza Richters, PA-C

## 2022-09-13 DIAGNOSIS — R6889 Other general symptoms and signs: Secondary | ICD-10-CM | POA: Diagnosis not present

## 2022-09-13 DIAGNOSIS — S92342K Displaced fracture of fourth metatarsal bone, left foot, subsequent encounter for fracture with nonunion: Secondary | ICD-10-CM | POA: Diagnosis not present

## 2022-09-14 DIAGNOSIS — D649 Anemia, unspecified: Secondary | ICD-10-CM | POA: Diagnosis not present

## 2022-09-14 DIAGNOSIS — S92342K Displaced fracture of fourth metatarsal bone, left foot, subsequent encounter for fracture with nonunion: Secondary | ICD-10-CM | POA: Diagnosis not present

## 2022-09-14 DIAGNOSIS — F419 Anxiety disorder, unspecified: Secondary | ICD-10-CM | POA: Diagnosis not present

## 2022-09-14 DIAGNOSIS — K219 Gastro-esophageal reflux disease without esophagitis: Secondary | ICD-10-CM | POA: Diagnosis not present

## 2022-09-14 DIAGNOSIS — A64 Unspecified sexually transmitted disease: Secondary | ICD-10-CM | POA: Diagnosis not present

## 2022-09-14 DIAGNOSIS — F319 Bipolar disorder, unspecified: Secondary | ICD-10-CM | POA: Diagnosis not present

## 2022-09-14 DIAGNOSIS — G40909 Epilepsy, unspecified, not intractable, without status epilepticus: Secondary | ICD-10-CM | POA: Diagnosis not present

## 2022-09-14 DIAGNOSIS — G43909 Migraine, unspecified, not intractable, without status migrainosus: Secondary | ICD-10-CM | POA: Diagnosis not present

## 2022-09-14 DIAGNOSIS — F901 Attention-deficit hyperactivity disorder, predominantly hyperactive type: Secondary | ICD-10-CM | POA: Diagnosis not present

## 2022-09-18 DIAGNOSIS — G40909 Epilepsy, unspecified, not intractable, without status epilepticus: Secondary | ICD-10-CM | POA: Diagnosis not present

## 2022-09-18 DIAGNOSIS — D649 Anemia, unspecified: Secondary | ICD-10-CM | POA: Diagnosis not present

## 2022-09-18 DIAGNOSIS — G43909 Migraine, unspecified, not intractable, without status migrainosus: Secondary | ICD-10-CM | POA: Diagnosis not present

## 2022-09-18 DIAGNOSIS — F419 Anxiety disorder, unspecified: Secondary | ICD-10-CM | POA: Diagnosis not present

## 2022-09-18 DIAGNOSIS — F901 Attention-deficit hyperactivity disorder, predominantly hyperactive type: Secondary | ICD-10-CM | POA: Diagnosis not present

## 2022-09-18 DIAGNOSIS — S92342K Displaced fracture of fourth metatarsal bone, left foot, subsequent encounter for fracture with nonunion: Secondary | ICD-10-CM | POA: Diagnosis not present

## 2022-09-18 DIAGNOSIS — F319 Bipolar disorder, unspecified: Secondary | ICD-10-CM | POA: Diagnosis not present

## 2022-09-18 DIAGNOSIS — A64 Unspecified sexually transmitted disease: Secondary | ICD-10-CM | POA: Diagnosis not present

## 2022-09-18 DIAGNOSIS — K219 Gastro-esophageal reflux disease without esophagitis: Secondary | ICD-10-CM | POA: Diagnosis not present

## 2022-09-19 DIAGNOSIS — F4310 Post-traumatic stress disorder, unspecified: Secondary | ICD-10-CM | POA: Diagnosis not present

## 2022-09-21 DIAGNOSIS — F901 Attention-deficit hyperactivity disorder, predominantly hyperactive type: Secondary | ICD-10-CM | POA: Diagnosis not present

## 2022-09-21 DIAGNOSIS — D649 Anemia, unspecified: Secondary | ICD-10-CM | POA: Diagnosis not present

## 2022-09-21 DIAGNOSIS — S92342K Displaced fracture of fourth metatarsal bone, left foot, subsequent encounter for fracture with nonunion: Secondary | ICD-10-CM | POA: Diagnosis not present

## 2022-09-21 DIAGNOSIS — A64 Unspecified sexually transmitted disease: Secondary | ICD-10-CM | POA: Diagnosis not present

## 2022-09-21 DIAGNOSIS — F319 Bipolar disorder, unspecified: Secondary | ICD-10-CM | POA: Diagnosis not present

## 2022-09-21 DIAGNOSIS — F419 Anxiety disorder, unspecified: Secondary | ICD-10-CM | POA: Diagnosis not present

## 2022-09-21 DIAGNOSIS — G43909 Migraine, unspecified, not intractable, without status migrainosus: Secondary | ICD-10-CM | POA: Diagnosis not present

## 2022-09-21 DIAGNOSIS — K219 Gastro-esophageal reflux disease without esophagitis: Secondary | ICD-10-CM | POA: Diagnosis not present

## 2022-09-21 DIAGNOSIS — G40909 Epilepsy, unspecified, not intractable, without status epilepticus: Secondary | ICD-10-CM | POA: Diagnosis not present

## 2022-09-25 DIAGNOSIS — G43909 Migraine, unspecified, not intractable, without status migrainosus: Secondary | ICD-10-CM | POA: Diagnosis not present

## 2022-09-25 DIAGNOSIS — D649 Anemia, unspecified: Secondary | ICD-10-CM | POA: Diagnosis not present

## 2022-09-25 DIAGNOSIS — F419 Anxiety disorder, unspecified: Secondary | ICD-10-CM | POA: Diagnosis not present

## 2022-09-25 DIAGNOSIS — F901 Attention-deficit hyperactivity disorder, predominantly hyperactive type: Secondary | ICD-10-CM | POA: Diagnosis not present

## 2022-09-25 DIAGNOSIS — A64 Unspecified sexually transmitted disease: Secondary | ICD-10-CM | POA: Diagnosis not present

## 2022-09-25 DIAGNOSIS — S92342K Displaced fracture of fourth metatarsal bone, left foot, subsequent encounter for fracture with nonunion: Secondary | ICD-10-CM | POA: Diagnosis not present

## 2022-09-25 DIAGNOSIS — F319 Bipolar disorder, unspecified: Secondary | ICD-10-CM | POA: Diagnosis not present

## 2022-09-25 DIAGNOSIS — G40909 Epilepsy, unspecified, not intractable, without status epilepticus: Secondary | ICD-10-CM | POA: Diagnosis not present

## 2022-09-25 DIAGNOSIS — K219 Gastro-esophageal reflux disease without esophagitis: Secondary | ICD-10-CM | POA: Diagnosis not present

## 2022-09-27 DIAGNOSIS — F4310 Post-traumatic stress disorder, unspecified: Secondary | ICD-10-CM | POA: Diagnosis not present

## 2022-09-28 ENCOUNTER — Other Ambulatory Visit: Payer: Self-pay | Admitting: Neurology

## 2022-10-03 DIAGNOSIS — F4310 Post-traumatic stress disorder, unspecified: Secondary | ICD-10-CM | POA: Diagnosis not present

## 2022-10-05 DIAGNOSIS — F419 Anxiety disorder, unspecified: Secondary | ICD-10-CM | POA: Diagnosis not present

## 2022-10-05 DIAGNOSIS — K219 Gastro-esophageal reflux disease without esophagitis: Secondary | ICD-10-CM | POA: Diagnosis not present

## 2022-10-05 DIAGNOSIS — F319 Bipolar disorder, unspecified: Secondary | ICD-10-CM | POA: Diagnosis not present

## 2022-10-05 DIAGNOSIS — S92342K Displaced fracture of fourth metatarsal bone, left foot, subsequent encounter for fracture with nonunion: Secondary | ICD-10-CM | POA: Diagnosis not present

## 2022-10-05 DIAGNOSIS — F901 Attention-deficit hyperactivity disorder, predominantly hyperactive type: Secondary | ICD-10-CM | POA: Diagnosis not present

## 2022-10-05 DIAGNOSIS — G43909 Migraine, unspecified, not intractable, without status migrainosus: Secondary | ICD-10-CM | POA: Diagnosis not present

## 2022-10-05 DIAGNOSIS — D649 Anemia, unspecified: Secondary | ICD-10-CM | POA: Diagnosis not present

## 2022-10-05 DIAGNOSIS — A64 Unspecified sexually transmitted disease: Secondary | ICD-10-CM | POA: Diagnosis not present

## 2022-10-05 DIAGNOSIS — G40909 Epilepsy, unspecified, not intractable, without status epilepticus: Secondary | ICD-10-CM | POA: Diagnosis not present

## 2022-10-13 ENCOUNTER — Ambulatory Visit: Payer: Medicare HMO | Admitting: Medical

## 2022-10-17 DIAGNOSIS — G40909 Epilepsy, unspecified, not intractable, without status epilepticus: Secondary | ICD-10-CM | POA: Diagnosis not present

## 2022-10-17 DIAGNOSIS — F419 Anxiety disorder, unspecified: Secondary | ICD-10-CM | POA: Diagnosis not present

## 2022-10-17 DIAGNOSIS — K219 Gastro-esophageal reflux disease without esophagitis: Secondary | ICD-10-CM | POA: Diagnosis not present

## 2022-10-17 DIAGNOSIS — D649 Anemia, unspecified: Secondary | ICD-10-CM | POA: Diagnosis not present

## 2022-10-17 DIAGNOSIS — G43909 Migraine, unspecified, not intractable, without status migrainosus: Secondary | ICD-10-CM | POA: Diagnosis not present

## 2022-10-17 DIAGNOSIS — F901 Attention-deficit hyperactivity disorder, predominantly hyperactive type: Secondary | ICD-10-CM | POA: Diagnosis not present

## 2022-10-17 DIAGNOSIS — F319 Bipolar disorder, unspecified: Secondary | ICD-10-CM | POA: Diagnosis not present

## 2022-10-17 DIAGNOSIS — S92342K Displaced fracture of fourth metatarsal bone, left foot, subsequent encounter for fracture with nonunion: Secondary | ICD-10-CM | POA: Diagnosis not present

## 2022-10-17 DIAGNOSIS — F4310 Post-traumatic stress disorder, unspecified: Secondary | ICD-10-CM | POA: Diagnosis not present

## 2022-10-17 DIAGNOSIS — A64 Unspecified sexually transmitted disease: Secondary | ICD-10-CM | POA: Diagnosis not present

## 2022-10-19 ENCOUNTER — Other Ambulatory Visit: Payer: Self-pay | Admitting: Medical

## 2022-10-19 DIAGNOSIS — Z1231 Encounter for screening mammogram for malignant neoplasm of breast: Secondary | ICD-10-CM

## 2022-10-20 ENCOUNTER — Ambulatory Visit: Payer: Medicare HMO

## 2022-10-22 DIAGNOSIS — K5900 Constipation, unspecified: Secondary | ICD-10-CM | POA: Diagnosis not present

## 2022-10-22 DIAGNOSIS — R630 Anorexia: Secondary | ICD-10-CM | POA: Diagnosis not present

## 2022-10-22 DIAGNOSIS — F17210 Nicotine dependence, cigarettes, uncomplicated: Secondary | ICD-10-CM | POA: Diagnosis not present

## 2022-10-22 DIAGNOSIS — J449 Chronic obstructive pulmonary disease, unspecified: Secondary | ICD-10-CM | POA: Diagnosis not present

## 2022-10-22 DIAGNOSIS — N12 Tubulo-interstitial nephritis, not specified as acute or chronic: Secondary | ICD-10-CM | POA: Diagnosis not present

## 2022-10-22 DIAGNOSIS — Z87442 Personal history of urinary calculi: Secondary | ICD-10-CM | POA: Diagnosis not present

## 2022-10-22 DIAGNOSIS — R109 Unspecified abdominal pain: Secondary | ICD-10-CM | POA: Diagnosis not present

## 2022-10-22 DIAGNOSIS — Z556 Problems related to health literacy: Secondary | ICD-10-CM | POA: Diagnosis not present

## 2022-10-22 DIAGNOSIS — Z90710 Acquired absence of both cervix and uterus: Secondary | ICD-10-CM | POA: Diagnosis not present

## 2022-10-25 DIAGNOSIS — R6889 Other general symptoms and signs: Secondary | ICD-10-CM | POA: Diagnosis not present

## 2022-10-25 DIAGNOSIS — S92342K Displaced fracture of fourth metatarsal bone, left foot, subsequent encounter for fracture with nonunion: Secondary | ICD-10-CM | POA: Diagnosis not present

## 2022-10-26 DIAGNOSIS — S92342K Displaced fracture of fourth metatarsal bone, left foot, subsequent encounter for fracture with nonunion: Secondary | ICD-10-CM | POA: Diagnosis not present

## 2022-10-26 DIAGNOSIS — A64 Unspecified sexually transmitted disease: Secondary | ICD-10-CM | POA: Diagnosis not present

## 2022-10-26 DIAGNOSIS — K219 Gastro-esophageal reflux disease without esophagitis: Secondary | ICD-10-CM | POA: Diagnosis not present

## 2022-10-26 DIAGNOSIS — D649 Anemia, unspecified: Secondary | ICD-10-CM | POA: Diagnosis not present

## 2022-10-26 DIAGNOSIS — G43909 Migraine, unspecified, not intractable, without status migrainosus: Secondary | ICD-10-CM | POA: Diagnosis not present

## 2022-10-26 DIAGNOSIS — F901 Attention-deficit hyperactivity disorder, predominantly hyperactive type: Secondary | ICD-10-CM | POA: Diagnosis not present

## 2022-10-26 DIAGNOSIS — F319 Bipolar disorder, unspecified: Secondary | ICD-10-CM | POA: Diagnosis not present

## 2022-10-26 DIAGNOSIS — F419 Anxiety disorder, unspecified: Secondary | ICD-10-CM | POA: Diagnosis not present

## 2022-10-26 DIAGNOSIS — G40909 Epilepsy, unspecified, not intractable, without status epilepticus: Secondary | ICD-10-CM | POA: Diagnosis not present

## 2022-10-30 DIAGNOSIS — K219 Gastro-esophageal reflux disease without esophagitis: Secondary | ICD-10-CM | POA: Diagnosis not present

## 2022-10-30 DIAGNOSIS — F901 Attention-deficit hyperactivity disorder, predominantly hyperactive type: Secondary | ICD-10-CM | POA: Diagnosis not present

## 2022-10-30 DIAGNOSIS — A64 Unspecified sexually transmitted disease: Secondary | ICD-10-CM | POA: Diagnosis not present

## 2022-10-30 DIAGNOSIS — F319 Bipolar disorder, unspecified: Secondary | ICD-10-CM | POA: Diagnosis not present

## 2022-10-30 DIAGNOSIS — G43909 Migraine, unspecified, not intractable, without status migrainosus: Secondary | ICD-10-CM | POA: Diagnosis not present

## 2022-10-30 DIAGNOSIS — F419 Anxiety disorder, unspecified: Secondary | ICD-10-CM | POA: Diagnosis not present

## 2022-10-30 DIAGNOSIS — S92342K Displaced fracture of fourth metatarsal bone, left foot, subsequent encounter for fracture with nonunion: Secondary | ICD-10-CM | POA: Diagnosis not present

## 2022-10-30 DIAGNOSIS — D649 Anemia, unspecified: Secondary | ICD-10-CM | POA: Diagnosis not present

## 2022-10-30 DIAGNOSIS — G40909 Epilepsy, unspecified, not intractable, without status epilepticus: Secondary | ICD-10-CM | POA: Diagnosis not present

## 2022-11-01 ENCOUNTER — Ambulatory Visit: Payer: Medicare HMO | Admitting: Medical

## 2022-11-01 ENCOUNTER — Ambulatory Visit: Payer: Medicare HMO

## 2022-11-03 ENCOUNTER — Encounter: Payer: Self-pay | Admitting: Pulmonary Disease

## 2022-11-03 ENCOUNTER — Ambulatory Visit: Payer: Medicare HMO | Admitting: Pulmonary Disease

## 2022-11-03 VITALS — BP 110/68 | HR 95 | Ht 65.0 in | Wt 136.2 lb

## 2022-11-03 DIAGNOSIS — J302 Other seasonal allergic rhinitis: Secondary | ICD-10-CM | POA: Diagnosis not present

## 2022-11-03 DIAGNOSIS — F17210 Nicotine dependence, cigarettes, uncomplicated: Secondary | ICD-10-CM | POA: Diagnosis not present

## 2022-11-03 DIAGNOSIS — R6889 Other general symptoms and signs: Secondary | ICD-10-CM | POA: Diagnosis not present

## 2022-11-03 DIAGNOSIS — R0602 Shortness of breath: Secondary | ICD-10-CM

## 2022-11-03 MED ORDER — ALBUTEROL SULFATE HFA 108 (90 BASE) MCG/ACT IN AERS: 1.0000 | INHALATION_SPRAY | RESPIRATORY_TRACT | 6 refills | Status: AC | PRN

## 2022-11-03 NOTE — Patient Instructions (Signed)
Continue to use symbicort inhaler 2 puffs twice daily with spacer - rinse mouth out after each use  Use albuterol inhaler 1-2 puffs every 4-6 hours as needed  Stop using advair inhaler  Start 21 mg nicotine patches daily and 2mg  mini nicotine lozenges as needed for break through cravings.   Call 1-800-quit-NOW to get free nicotine replacement and counseling from the state of [place]    Follow up in 2 months for pulmonary function tests
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
## 2022-11-06 ENCOUNTER — Encounter: Payer: Self-pay | Admitting: Pulmonary Disease

## 2022-11-09 DIAGNOSIS — F4310 Post-traumatic stress disorder, unspecified: Secondary | ICD-10-CM | POA: Diagnosis not present

## 2022-11-14 DIAGNOSIS — F4310 Post-traumatic stress disorder, unspecified: Secondary | ICD-10-CM | POA: Diagnosis not present

## 2022-11-17 ENCOUNTER — Ambulatory Visit
Admission: RE | Admit: 2022-11-17 | Discharge: 2022-11-17 | Disposition: A | Discharge status: Home / Self Care | Payer: Medicare HMO | Source: Ambulatory Visit | Attending: Medical | Admitting: Medical

## 2022-11-17 DIAGNOSIS — Z1231 Encounter for screening mammogram for malignant neoplasm of breast: Secondary | ICD-10-CM | POA: Diagnosis not present

## 2022-11-17 DIAGNOSIS — R6889 Other general symptoms and signs: Secondary | ICD-10-CM | POA: Diagnosis not present

## 2022-11-21 DIAGNOSIS — F4310 Post-traumatic stress disorder, unspecified: Secondary | ICD-10-CM | POA: Diagnosis not present

## 2022-12-05 DIAGNOSIS — F4310 Post-traumatic stress disorder, unspecified: Secondary | ICD-10-CM | POA: Diagnosis not present

## 2022-12-13 ENCOUNTER — Telehealth: Payer: Medicare HMO | Admitting: Primary Care

## 2022-12-13 ENCOUNTER — Encounter (HOSPITAL_BASED_OUTPATIENT_CLINIC_OR_DEPARTMENT_OTHER): Payer: Medicare HMO

## 2022-12-19 ENCOUNTER — Telehealth: Payer: Self-pay | Admitting: Medical

## 2022-12-19 NOTE — Telephone Encounter (Signed)
Copied from CRM [id number]. Topic: Medicare AWV >> Dec 19, 2022  1:04 PM Payton Doughty wrote: Reason for CRM: Called 12/19/2022 to sched Annual Wellness Visit - NO VOICEMAIL  Verlee Rossetti; Care Guide Ambulatory Clinical Support [facility] Direct Dial: [phone]

## 2022-12-26 DIAGNOSIS — F3112 Bipolar disorder, current episode manic without psychotic features, moderate: Secondary | ICD-10-CM | POA: Diagnosis not present

## 2022-12-26 DIAGNOSIS — F4310 Post-traumatic stress disorder, unspecified: Secondary | ICD-10-CM | POA: Diagnosis not present

## 2023-01-14 DIAGNOSIS — R300 Dysuria: Secondary | ICD-10-CM | POA: Diagnosis not present

## 2023-01-14 DIAGNOSIS — R319 Hematuria, unspecified: Secondary | ICD-10-CM | POA: Diagnosis not present

## 2023-01-14 DIAGNOSIS — R509 Fever, unspecified: Secondary | ICD-10-CM | POA: Diagnosis not present

## 2023-01-14 DIAGNOSIS — N390 Urinary tract infection, site not specified: Secondary | ICD-10-CM | POA: Diagnosis not present

## 2023-01-19 DIAGNOSIS — F4310 Post-traumatic stress disorder, unspecified: Secondary | ICD-10-CM | POA: Diagnosis not present

## 2023-01-30 DIAGNOSIS — F4310 Post-traumatic stress disorder, unspecified: Secondary | ICD-10-CM | POA: Diagnosis not present

## 2023-01-30 DIAGNOSIS — F3112 Bipolar disorder, current episode manic without psychotic features, moderate: Secondary | ICD-10-CM | POA: Diagnosis not present

## 2023-03-13 ENCOUNTER — Ambulatory Visit: Payer: Medicare HMO | Admitting: Neurology

## 2023-03-13 DIAGNOSIS — F4310 Post-traumatic stress disorder, unspecified: Secondary | ICD-10-CM | POA: Diagnosis not present

## 2023-03-13 DIAGNOSIS — F3112 Bipolar disorder, current episode manic without psychotic features, moderate: Secondary | ICD-10-CM | POA: Diagnosis not present

## 2023-03-20 DIAGNOSIS — F4310 Post-traumatic stress disorder, unspecified: Secondary | ICD-10-CM | POA: Diagnosis not present

## 2023-05-01 DIAGNOSIS — F4310 Post-traumatic stress disorder, unspecified: Secondary | ICD-10-CM | POA: Diagnosis not present

## 2023-06-30 ENCOUNTER — Other Ambulatory Visit: Payer: Self-pay | Admitting: Medical

## 2023-07-10 DIAGNOSIS — F4310 Post-traumatic stress disorder, unspecified: Secondary | ICD-10-CM | POA: Diagnosis not present

## 2023-07-10 DIAGNOSIS — F3112 Bipolar disorder, current episode manic without psychotic features, moderate: Secondary | ICD-10-CM | POA: Diagnosis not present

## 2023-08-02 DIAGNOSIS — F4310 Post-traumatic stress disorder, unspecified: Secondary | ICD-10-CM | POA: Diagnosis not present

## 2023-08-06 ENCOUNTER — Other Ambulatory Visit: Payer: Self-pay | Admitting: Medical

## 2023-08-21 DIAGNOSIS — F3112 Bipolar disorder, current episode manic without psychotic features, moderate: Secondary | ICD-10-CM | POA: Diagnosis not present

## 2023-09-05 DIAGNOSIS — G40919 Epilepsy, unspecified, intractable, without status epilepticus: Secondary | ICD-10-CM | POA: Diagnosis not present

## 2023-09-05 DIAGNOSIS — L853 Xerosis cutis: Secondary | ICD-10-CM | POA: Diagnosis not present

## 2023-09-05 DIAGNOSIS — L659 Nonscarring hair loss, unspecified: Secondary | ICD-10-CM | POA: Diagnosis not present

## 2023-09-05 DIAGNOSIS — R232 Flushing: Secondary | ICD-10-CM | POA: Diagnosis not present

## 2023-09-10 DIAGNOSIS — Z0000 Encounter for general adult medical examination without abnormal findings: Secondary | ICD-10-CM | POA: Diagnosis not present

## 2023-09-10 DIAGNOSIS — Z13220 Encounter for screening for lipoid disorders: Secondary | ICD-10-CM | POA: Diagnosis not present

## 2023-09-10 DIAGNOSIS — L659 Nonscarring hair loss, unspecified: Secondary | ICD-10-CM | POA: Diagnosis not present

## 2023-09-10 DIAGNOSIS — L853 Xerosis cutis: Secondary | ICD-10-CM | POA: Diagnosis not present

## 2023-10-01 DIAGNOSIS — Z113 Encounter for screening for infections with a predominantly sexual mode of transmission: Secondary | ICD-10-CM | POA: Diagnosis not present

## 2023-10-01 DIAGNOSIS — R300 Dysuria: Secondary | ICD-10-CM | POA: Diagnosis not present

## 2023-10-02 DIAGNOSIS — Z113 Encounter for screening for infections with a predominantly sexual mode of transmission: Secondary | ICD-10-CM | POA: Diagnosis not present

## 2023-10-02 DIAGNOSIS — N3000 Acute cystitis without hematuria: Secondary | ICD-10-CM | POA: Diagnosis not present

## 2023-10-02 DIAGNOSIS — F4312 Post-traumatic stress disorder, chronic: Secondary | ICD-10-CM | POA: Diagnosis not present

## 2023-10-03 ENCOUNTER — Encounter: Payer: Self-pay | Admitting: Neurology

## 2023-10-03 ENCOUNTER — Ambulatory Visit (INDEPENDENT_AMBULATORY_CARE_PROVIDER_SITE_OTHER): Payer: Medicare HMO | Admitting: Neurology

## 2023-10-03 VITALS — BP 124/81 | HR 77 | Ht 64.0 in | Wt 134.0 lb

## 2023-10-03 DIAGNOSIS — R569 Unspecified convulsions: Secondary | ICD-10-CM | POA: Diagnosis not present

## 2023-10-03 MED ORDER — LEVETIRACETAM 500 MG PO TABS: ORAL_TABLET | ORAL | 4 refills | Status: AC
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
## 2023-10-08 DIAGNOSIS — R809 Proteinuria, unspecified: Secondary | ICD-10-CM | POA: Diagnosis not present

## 2023-10-08 DIAGNOSIS — Z113 Encounter for screening for infections with a predominantly sexual mode of transmission: Secondary | ICD-10-CM | POA: Diagnosis not present

## 2023-11-02 DIAGNOSIS — F3112 Bipolar disorder, current episode manic without psychotic features, moderate: Secondary | ICD-10-CM | POA: Diagnosis not present

## 2023-11-07 DIAGNOSIS — M79641 Pain in right hand: Secondary | ICD-10-CM | POA: Diagnosis not present

## 2023-11-13 DIAGNOSIS — F4312 Post-traumatic stress disorder, chronic: Secondary | ICD-10-CM | POA: Diagnosis not present

## 2023-11-21 DIAGNOSIS — R92343 Mammographic extreme density, bilateral breasts: Secondary | ICD-10-CM | POA: Diagnosis not present

## 2023-11-21 DIAGNOSIS — Z1231 Encounter for screening mammogram for malignant neoplasm of breast: Secondary | ICD-10-CM | POA: Diagnosis not present

## 2023-11-23 DIAGNOSIS — M7989 Other specified soft tissue disorders: Secondary | ICD-10-CM | POA: Diagnosis not present

## 2023-11-23 DIAGNOSIS — M7711 Lateral epicondylitis, right elbow: Secondary | ICD-10-CM | POA: Diagnosis not present

## 2023-11-23 DIAGNOSIS — G5621 Lesion of ulnar nerve, right upper limb: Secondary | ICD-10-CM | POA: Diagnosis not present

## 2023-11-23 DIAGNOSIS — M79641 Pain in right hand: Secondary | ICD-10-CM | POA: Diagnosis not present

## 2023-11-30 DIAGNOSIS — M1990 Unspecified osteoarthritis, unspecified site: Secondary | ICD-10-CM | POA: Diagnosis not present

## 2023-11-30 DIAGNOSIS — K219 Gastro-esophageal reflux disease without esophagitis: Secondary | ICD-10-CM | POA: Diagnosis not present

## 2023-11-30 DIAGNOSIS — F17210 Nicotine dependence, cigarettes, uncomplicated: Secondary | ICD-10-CM | POA: Diagnosis not present

## 2023-11-30 DIAGNOSIS — M79641 Pain in right hand: Secondary | ICD-10-CM | POA: Diagnosis not present

## 2023-11-30 DIAGNOSIS — F32A Depression, unspecified: Secondary | ICD-10-CM | POA: Diagnosis not present

## 2023-11-30 DIAGNOSIS — M7711 Lateral epicondylitis, right elbow: Secondary | ICD-10-CM | POA: Diagnosis not present

## 2023-11-30 DIAGNOSIS — G5621 Lesion of ulnar nerve, right upper limb: Secondary | ICD-10-CM | POA: Diagnosis not present

## 2023-11-30 DIAGNOSIS — J45909 Unspecified asthma, uncomplicated: Secondary | ICD-10-CM | POA: Diagnosis not present

## 2023-11-30 DIAGNOSIS — F419 Anxiety disorder, unspecified: Secondary | ICD-10-CM | POA: Diagnosis not present

## 2023-12-03 DIAGNOSIS — F17210 Nicotine dependence, cigarettes, uncomplicated: Secondary | ICD-10-CM | POA: Diagnosis not present

## 2023-12-03 DIAGNOSIS — M1990 Unspecified osteoarthritis, unspecified site: Secondary | ICD-10-CM | POA: Diagnosis not present

## 2023-12-03 DIAGNOSIS — F32A Depression, unspecified: Secondary | ICD-10-CM | POA: Diagnosis not present

## 2023-12-03 DIAGNOSIS — M79641 Pain in right hand: Secondary | ICD-10-CM | POA: Diagnosis not present

## 2023-12-03 DIAGNOSIS — K219 Gastro-esophageal reflux disease without esophagitis: Secondary | ICD-10-CM | POA: Diagnosis not present

## 2023-12-03 DIAGNOSIS — G5621 Lesion of ulnar nerve, right upper limb: Secondary | ICD-10-CM | POA: Diagnosis not present

## 2023-12-03 DIAGNOSIS — M7711 Lateral epicondylitis, right elbow: Secondary | ICD-10-CM | POA: Diagnosis not present

## 2023-12-03 DIAGNOSIS — F419 Anxiety disorder, unspecified: Secondary | ICD-10-CM | POA: Diagnosis not present

## 2023-12-03 DIAGNOSIS — J45909 Unspecified asthma, uncomplicated: Secondary | ICD-10-CM | POA: Diagnosis not present

## 2023-12-05 DIAGNOSIS — M1990 Unspecified osteoarthritis, unspecified site: Secondary | ICD-10-CM | POA: Diagnosis not present

## 2023-12-05 DIAGNOSIS — M7711 Lateral epicondylitis, right elbow: Secondary | ICD-10-CM | POA: Diagnosis not present

## 2023-12-05 DIAGNOSIS — F17210 Nicotine dependence, cigarettes, uncomplicated: Secondary | ICD-10-CM | POA: Diagnosis not present

## 2023-12-05 DIAGNOSIS — M79641 Pain in right hand: Secondary | ICD-10-CM | POA: Diagnosis not present

## 2023-12-05 DIAGNOSIS — J45909 Unspecified asthma, uncomplicated: Secondary | ICD-10-CM | POA: Diagnosis not present

## 2023-12-05 DIAGNOSIS — F32A Depression, unspecified: Secondary | ICD-10-CM | POA: Diagnosis not present

## 2023-12-05 DIAGNOSIS — G5621 Lesion of ulnar nerve, right upper limb: Secondary | ICD-10-CM | POA: Diagnosis not present

## 2023-12-05 DIAGNOSIS — K219 Gastro-esophageal reflux disease without esophagitis: Secondary | ICD-10-CM | POA: Diagnosis not present

## 2023-12-05 DIAGNOSIS — F419 Anxiety disorder, unspecified: Secondary | ICD-10-CM | POA: Diagnosis not present

## 2023-12-10 DIAGNOSIS — G5621 Lesion of ulnar nerve, right upper limb: Secondary | ICD-10-CM | POA: Diagnosis not present

## 2023-12-10 DIAGNOSIS — F32A Depression, unspecified: Secondary | ICD-10-CM | POA: Diagnosis not present

## 2023-12-10 DIAGNOSIS — F17210 Nicotine dependence, cigarettes, uncomplicated: Secondary | ICD-10-CM | POA: Diagnosis not present

## 2023-12-10 DIAGNOSIS — F419 Anxiety disorder, unspecified: Secondary | ICD-10-CM | POA: Diagnosis not present

## 2023-12-10 DIAGNOSIS — M79641 Pain in right hand: Secondary | ICD-10-CM | POA: Diagnosis not present

## 2023-12-10 DIAGNOSIS — M1990 Unspecified osteoarthritis, unspecified site: Secondary | ICD-10-CM | POA: Diagnosis not present

## 2023-12-10 DIAGNOSIS — K219 Gastro-esophageal reflux disease without esophagitis: Secondary | ICD-10-CM | POA: Diagnosis not present

## 2023-12-10 DIAGNOSIS — M7711 Lateral epicondylitis, right elbow: Secondary | ICD-10-CM | POA: Diagnosis not present

## 2023-12-11 DIAGNOSIS — F419 Anxiety disorder, unspecified: Secondary | ICD-10-CM | POA: Diagnosis not present

## 2023-12-12 DIAGNOSIS — F419 Anxiety disorder, unspecified: Secondary | ICD-10-CM | POA: Diagnosis not present

## 2023-12-24 DIAGNOSIS — K219 Gastro-esophageal reflux disease without esophagitis: Secondary | ICD-10-CM | POA: Diagnosis not present

## 2023-12-24 DIAGNOSIS — F32A Depression, unspecified: Secondary | ICD-10-CM | POA: Diagnosis not present

## 2023-12-24 DIAGNOSIS — J45909 Unspecified asthma, uncomplicated: Secondary | ICD-10-CM | POA: Diagnosis not present

## 2023-12-24 DIAGNOSIS — F17210 Nicotine dependence, cigarettes, uncomplicated: Secondary | ICD-10-CM | POA: Diagnosis not present

## 2023-12-24 DIAGNOSIS — M79641 Pain in right hand: Secondary | ICD-10-CM | POA: Diagnosis not present

## 2023-12-24 DIAGNOSIS — M1990 Unspecified osteoarthritis, unspecified site: Secondary | ICD-10-CM | POA: Diagnosis not present

## 2023-12-24 DIAGNOSIS — F419 Anxiety disorder, unspecified: Secondary | ICD-10-CM | POA: Diagnosis not present

## 2023-12-24 DIAGNOSIS — M7711 Lateral epicondylitis, right elbow: Secondary | ICD-10-CM | POA: Diagnosis not present

## 2023-12-24 DIAGNOSIS — G5621 Lesion of ulnar nerve, right upper limb: Secondary | ICD-10-CM | POA: Diagnosis not present

## 2024-01-08 DIAGNOSIS — M79641 Pain in right hand: Secondary | ICD-10-CM | POA: Diagnosis not present

## 2024-01-10 DIAGNOSIS — K219 Gastro-esophageal reflux disease without esophagitis: Secondary | ICD-10-CM | POA: Diagnosis not present

## 2024-01-10 DIAGNOSIS — F419 Anxiety disorder, unspecified: Secondary | ICD-10-CM | POA: Diagnosis not present

## 2024-01-10 DIAGNOSIS — J45909 Unspecified asthma, uncomplicated: Secondary | ICD-10-CM | POA: Diagnosis not present

## 2024-01-10 DIAGNOSIS — F17210 Nicotine dependence, cigarettes, uncomplicated: Secondary | ICD-10-CM | POA: Diagnosis not present

## 2024-01-10 DIAGNOSIS — G5621 Lesion of ulnar nerve, right upper limb: Secondary | ICD-10-CM | POA: Diagnosis not present

## 2024-01-10 DIAGNOSIS — F32A Depression, unspecified: Secondary | ICD-10-CM | POA: Diagnosis not present

## 2024-01-10 DIAGNOSIS — M1990 Unspecified osteoarthritis, unspecified site: Secondary | ICD-10-CM | POA: Diagnosis not present

## 2024-01-10 DIAGNOSIS — M7711 Lateral epicondylitis, right elbow: Secondary | ICD-10-CM | POA: Diagnosis not present

## 2024-01-10 DIAGNOSIS — M79641 Pain in right hand: Secondary | ICD-10-CM | POA: Diagnosis not present

## 2024-01-15 DIAGNOSIS — F4312 Post-traumatic stress disorder, chronic: Secondary | ICD-10-CM | POA: Diagnosis not present

## 2024-01-22 DIAGNOSIS — R030 Elevated blood-pressure reading, without diagnosis of hypertension: Secondary | ICD-10-CM | POA: Diagnosis not present

## 2024-01-22 DIAGNOSIS — F17200 Nicotine dependence, unspecified, uncomplicated: Secondary | ICD-10-CM | POA: Diagnosis not present

## 2024-01-23 ENCOUNTER — Encounter: Payer: Self-pay | Admitting: Neurology

## 2024-01-24 DIAGNOSIS — M7711 Lateral epicondylitis, right elbow: Secondary | ICD-10-CM | POA: Diagnosis not present

## 2024-01-24 DIAGNOSIS — F17210 Nicotine dependence, cigarettes, uncomplicated: Secondary | ICD-10-CM | POA: Diagnosis not present

## 2024-01-24 DIAGNOSIS — M1990 Unspecified osteoarthritis, unspecified site: Secondary | ICD-10-CM | POA: Diagnosis not present

## 2024-01-24 DIAGNOSIS — F32A Depression, unspecified: Secondary | ICD-10-CM | POA: Diagnosis not present

## 2024-01-24 DIAGNOSIS — F419 Anxiety disorder, unspecified: Secondary | ICD-10-CM | POA: Diagnosis not present

## 2024-01-24 DIAGNOSIS — M79641 Pain in right hand: Secondary | ICD-10-CM | POA: Diagnosis not present

## 2024-01-24 DIAGNOSIS — J45909 Unspecified asthma, uncomplicated: Secondary | ICD-10-CM | POA: Diagnosis not present

## 2024-01-24 DIAGNOSIS — G5621 Lesion of ulnar nerve, right upper limb: Secondary | ICD-10-CM | POA: Diagnosis not present

## 2024-01-24 DIAGNOSIS — K219 Gastro-esophageal reflux disease without esophagitis: Secondary | ICD-10-CM | POA: Diagnosis not present

## 2024-01-25 DIAGNOSIS — F1499 Cocaine use, unspecified with unspecified cocaine-induced disorder: Secondary | ICD-10-CM | POA: Diagnosis not present

## 2024-01-29 DIAGNOSIS — M65841 Other synovitis and tenosynovitis, right hand: Secondary | ICD-10-CM | POA: Diagnosis not present

## 2024-02-07 DIAGNOSIS — G5601 Carpal tunnel syndrome, right upper limb: Secondary | ICD-10-CM | POA: Diagnosis not present

## 2024-02-07 DIAGNOSIS — Z7409 Other reduced mobility: Secondary | ICD-10-CM | POA: Diagnosis not present

## 2024-10-02 ENCOUNTER — Ambulatory Visit: Admitting: Neurology
